# Patient Record
Sex: Male | Born: 1990 | Race: Black or African American | Hispanic: No | Marital: Married
Health system: Southern US, Community
[De-identification: ages and names within clinical notes are randomized; demographics above are authoritative.]

## PROBLEM LIST (undated history)

## (undated) DIAGNOSIS — Z21 Asymptomatic human immunodeficiency virus [HIV] infection status: Secondary | ICD-10-CM

## (undated) DIAGNOSIS — R7303 Prediabetes: Secondary | ICD-10-CM

## (undated) DIAGNOSIS — B20 Human immunodeficiency virus [HIV] disease: Secondary | ICD-10-CM

## (undated) DIAGNOSIS — J329 Chronic sinusitis, unspecified: Secondary | ICD-10-CM

## (undated) DIAGNOSIS — G43909 Migraine, unspecified, not intractable, without status migrainosus: Secondary | ICD-10-CM

## (undated) DIAGNOSIS — I10 Essential (primary) hypertension: Secondary | ICD-10-CM

## (undated) HISTORY — PX: HEMORROIDECTOMY: SUR656

## (undated) HISTORY — DX: Human immunodeficiency virus (HIV) disease: B20

## (undated) HISTORY — DX: Prediabetes: R73.03

## (undated) HISTORY — DX: Asymptomatic human immunodeficiency virus (hiv) infection status: Z21

## (undated) HISTORY — DX: Essential (primary) hypertension: I10

---
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
## 2008-05-05 ENCOUNTER — Emergency Department (HOSPITAL_COMMUNITY): Admission: EM | Admit: 2008-05-05 | Discharge: 2008-05-06 | Payer: Self-pay | Admitting: Emergency Medicine

## 2008-09-16 ENCOUNTER — Ambulatory Visit: Payer: Self-pay | Admitting: Family Medicine

## 2008-09-16 ENCOUNTER — Encounter: Payer: Self-pay | Admitting: Family Medicine

## 2008-09-19 ENCOUNTER — Emergency Department (HOSPITAL_COMMUNITY): Admission: EM | Admit: 2008-09-19 | Discharge: 2008-09-19 | Payer: Self-pay | Admitting: Emergency Medicine

## 2008-10-10 ENCOUNTER — Encounter: Payer: Self-pay | Admitting: Family Medicine

## 2008-10-10 ENCOUNTER — Ambulatory Visit: Payer: Self-pay | Admitting: Family Medicine

## 2008-10-13 ENCOUNTER — Encounter: Payer: Self-pay | Admitting: Family Medicine

## 2008-10-27 ENCOUNTER — Ambulatory Visit: Payer: Self-pay | Admitting: Family Medicine

## 2008-10-27 ENCOUNTER — Telehealth: Payer: Self-pay | Admitting: Family Medicine

## 2008-10-27 DIAGNOSIS — A630 Anogenital (venereal) warts: Secondary | ICD-10-CM

## 2008-11-21 ENCOUNTER — Emergency Department (HOSPITAL_COMMUNITY): Admission: EM | Admit: 2008-11-21 | Discharge: 2008-11-22 | Payer: Self-pay | Admitting: Emergency Medicine

## 2009-01-15 ENCOUNTER — Ambulatory Visit: Payer: Self-pay | Admitting: Family Medicine

## 2009-01-23 ENCOUNTER — Ambulatory Visit: Payer: Self-pay | Admitting: Family Medicine

## 2009-03-10 ENCOUNTER — Ambulatory Visit (HOSPITAL_BASED_OUTPATIENT_CLINIC_OR_DEPARTMENT_OTHER): Admission: RE | Admit: 2009-03-10 | Discharge: 2009-03-10 | Payer: Self-pay | Admitting: Surgery

## 2009-03-26 ENCOUNTER — Encounter: Payer: Self-pay | Admitting: Family Medicine

## 2009-05-13 ENCOUNTER — Emergency Department (HOSPITAL_COMMUNITY): Admission: EM | Admit: 2009-05-13 | Discharge: 2009-05-13 | Payer: Self-pay | Admitting: Emergency Medicine

## 2009-06-03 ENCOUNTER — Ambulatory Visit: Payer: Self-pay | Admitting: Family Medicine

## 2009-06-18 ENCOUNTER — Encounter: Payer: Self-pay | Admitting: Family Medicine

## 2009-08-03 ENCOUNTER — Emergency Department (HOSPITAL_COMMUNITY): Admission: EM | Admit: 2009-08-03 | Discharge: 2009-08-03 | Payer: Self-pay | Admitting: Emergency Medicine

## 2009-09-28 ENCOUNTER — Emergency Department (HOSPITAL_COMMUNITY): Admission: EM | Admit: 2009-09-28 | Discharge: 2009-09-29 | Payer: Self-pay | Admitting: Emergency Medicine

## 2009-10-16 ENCOUNTER — Encounter: Payer: Self-pay | Admitting: Family Medicine

## 2009-10-16 ENCOUNTER — Ambulatory Visit: Payer: Self-pay | Admitting: Family Medicine

## 2009-10-16 LAB — CONVERTED CEMR LAB
Chlamydia, Swab/Urine, PCR: NEGATIVE
GC Probe Amp, Urine: NEGATIVE

## 2009-11-24 ENCOUNTER — Emergency Department (HOSPITAL_COMMUNITY)
Admission: EM | Admit: 2009-11-24 | Discharge: 2009-11-24 | Payer: Self-pay | Source: Home / Self Care | Admitting: Emergency Medicine

## 2009-12-14 ENCOUNTER — Ambulatory Visit: Payer: Self-pay | Admitting: Family Medicine

## 2009-12-14 DIAGNOSIS — F411 Generalized anxiety disorder: Secondary | ICD-10-CM | POA: Insufficient documentation

## 2009-12-14 DIAGNOSIS — G44209 Tension-type headache, unspecified, not intractable: Secondary | ICD-10-CM

## 2010-02-09 NOTE — Assessment & Plan Note (Signed)
Summary: hemorriods,tcb   Vital Signs:  Patient profile:   20 year old male Height:      72.5 inches Weight:      186.8 pounds BMI:     25.08 Temp:     97.7 degrees F oral Pulse rate:   74 / minute BP sitting:   132 / 73  (left arm) Cuff size:   regular  Vitals Entered By: Garen Grams LPN (January 23, 2009 4:03 PM) CC: f/u hemmorhoids Is Patient Diabetic? No Pain Assessment Patient in pain? no        Primary Care Provider:  Myrtie Soman  MD  CC:  f/u hemmorhoids.  History of Present Illness: 1. condyloma States that these are not any better. Not particularly painful. Can feel them when he wipes and they often itch. This causes him to scratch until there is bleeding. Knows they're there all the time and this is troubling to him. Tries to keep the area very clean and scrubs the area when showering. Has not picked up HC cream yet (has rx).  Is homosexual and engages in anal sex. States that he always uses protection.  ROS: no blood in stool; no pain with bowel movements. No fever.     Habits & Providers  Alcohol-Tobacco-Diet     Tobacco Status: current     Cigarette Packs/Day: <0.25  Current Medications (verified): 1)  Anusol-Hc 25 Mg Supp (Hydrocortisone Acetate) .... Insert Into Anus Two Times A Day X2 Weeks. 2)  Anusol-Hc 2.5 % Crea (Hydrocortisone) .... Apply To Anus Two Times A Day X1 Month.  Disp Qs X1 Month.  Allergies (verified): No Known Drug Allergies  Social History: Smoking Status:  current Packs/Day:  <0.25  Physical Exam  General:  Well-developed,well-nourished,in no acute distress; alert,appropriate and cooperative throughout examination Rectal:  multiple external condyloma; no bleeding. no erythema, drainage, or discharge; normal rectal tone; no obvious hemorrhoids on external/internal digital examination. Anoscopy not performed.  Prostate:  Prostate gland firm and smooth, no enlargement, nodularity, tenderness, mass, asymmetry or  induration. Skin:  turgor normal, color normal, no rashes, and no suspicious lesions.     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 surgery. Gave information hand out. Counseled re: safe sex. Counseled regarding HPV and its assocation wtih oropharyngeal cancer.   Orders: Surgical Referral (Surgery) [facility]- Est Level  3 (29562)  Complete Medication List: 1)  Anusol-hc 25 Mg Supp (Hydrocortisone acetate) .... Insert into anus two times a day x2 weeks. 2)  Anusol-hc 2.5 % Crea (Hydrocortisone) .... Apply to anus two times a day x1 month.  disp qs x1 month.  Patient Instructions: 1)  we'll work on setting you up with the surgery clinic to have these looked at.  2)  In the meantime use the cream. 3)  Don't rub or scrub the area. This can make your itching and irritation worse. Use only water and mild soap for cleaning.

## 2010-02-09 NOTE — Assessment & Plan Note (Signed)
Summary: hemm,df   Vital Signs:  Patient profile:   20 year old male Height:      72.5 inches Weight:      186 pounds BMI:     24.97 Temp:     97.9 degrees F oral Pulse rate:   71 / minute BP sitting:   150 / 77  (left arm) Cuff size:   regular  Vitals Entered By: Tessie Fass CMA (January 15, 2009 1:38 PM) CC: hemorrhoids Is Patient Diabetic? No Pain Assessment Patient in pain? yes      Intensity: 9   Primary Care Provider:  Myrtie Soman  MD  CC:  hemorrhoids.  History of Present Illness: Mr. Allen comes in today with complaint of anal pain and masses he believes are hemorrhoids.  He was seen in October with same symptoms.  At that time he was found to have an anal condyloma accuminata and anoscopy was performed showing a fissure and internal hemorrhoid.  He was given hemorrhoid cream and suppository but didn't purchase because he couldn't afford.  However, the pain resolved on its own.  For last week has had similar pain again.  Hurts when he wipes and occassionally has drops of blood on the tissue. Wants the hemorrhoids removed (clarified and he wants the masses, or warts, removed) as he feels these are causing his pain.  He does engage in anal sex but not recently.  No contipation or diarrhea.    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  Discussed it can be removed with chemicals or freezing but also can still recur and these treatments can be painful.  Discussed we don't have  the chemicals here and so he would likely need referral to derm.  Return for recheck in 3-4 weeks.  If pain resolved, can further discuss treatment issues for the warts.   Orders: [facility]- Est  Level 4 (16109)  Problem # 2:  ANAL OR RECTAL PAIN (UEA-540.98) Assessment: Deteriorated  Precepted with Dr. Deirdre Priest.  Anoscopy deferred due to extreme tenderness.  No apparent fissure on external exam.  Likely small internal fissure or recurrence of hemorrhoids seen on anoscopy in October.  Now has debra hill assistance.  Re-prescripted anusol cream and suppository for pain.  Advised to return in 3-4 weeks for recheck.  If still having a lot of pain at that time, will need anoscopy.   Orders: [facility]- Est  Level 4 (99214)  Complete Medication List: 1)  Anusol-hc 25 Mg Supp (Hydrocortisone acetate) .... Insert into anus two times a day x2 weeks. 2)  Anusol-hc 2.5 % Crea (Hydrocortisone) .... Apply to anus two times a day x1 month.  disp qs x1 month.  Patient Instructions: 1)  The masses you feel on the outside are called condyloma, or warts.  They are not usually painful.  What is painful are hemorrhoids and fissures.  You likely have a recurrence of your hemorrhoids that you had in October.  Please use the 2 medicines for pain for the next 2-3 weeks to see if these calm  down your discomfort.  2)  Condylomata are not easy to treat.  Surgery to take them off is actually the last resort.  Usually chemicals or freezing are used first and this can be painful.  In addition, new warts can still come back.  However, if you still desire treatment to remove the actual masses even after the pain is subsided, we can try to get you to dermatology.  Unfortunately, we do not stock the chemicals necessary to treat them here.  3)  Please follow-up with your primary doctor, Dr. Rexene Alberts, in 3 weeks for a recheck to see how you are doing and what you would like to do about the masses.  Prescriptions: ANUSOL-HC 2.5 % CREA  (HYDROCORTISONE) Apply to anus two times a day x1 month.  Disp QS x1 month.  #1 x 0   Entered and Authorized by:   Ardeen Garland  MD   Signed by:   Ardeen Garland  MD on 01/15/2009   Method used:   Print then Give to Patient   RxID:   [id number] ANUSOL-HC 25 MG SUPP (HYDROCORTISONE ACETATE) Insert into anus two times a day x2 weeks.  #28 x 0   Entered and Authorized by:   Ardeen Garland  MD   Signed by:   Ardeen Garland  MD on 01/15/2009   Method used:   Print then Give to Patient   RxID:   [id number]
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
## 2010-02-09 NOTE — Consult Note (Signed)
Summary: [facility]   Imported By: De Nurse 06/10/2009 16:03:59  _____________________________________________________________________  External Attachment:    Type:   Image     Comment:   External Document
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
## 2010-02-09 NOTE — Letter (Signed)
Summary: Generic Letter  [facility]  [address]   [place], [place] [address]   Phone: [phone]  Fax: [phone]    10/13/2008  BRILEY SULTON [address] [place], [place]  [address]  Dear Mr. Bitterman,  I wanted to let you know that your HIV and syphilis tests came back normal. Remember to always use condoms. I hope that your stomach is feeling better. Good to see you the other day.  Sincerely,   Myrtie Soman  MD  Appended Document: Generic Letter mailed.

## 2010-02-09 NOTE — Miscellaneous (Signed)
   Clinical Lists Changes  Problems: Removed problem of ABDOMINAL PAIN, PERIUMBILIC (ICD-789.05) Removed problem of ANAL OR RECTAL PAIN (JWJ-191.47) Medications: Removed medication of PERCOCET 5-325 MG TABS (OXYCODONE-ACETAMINOPHEN) 1-2 by mouth every 4-6 hours as needed for severe pain Removed medication of FLEXERIL 10 MG TABS (CYCLOBENZAPRINE HCL) take one by mouth three times a day as needed for spasm
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
## 2010-02-10 ENCOUNTER — Emergency Department (HOSPITAL_COMMUNITY)
Admission: EM | Admit: 2010-02-10 | Discharge: 2010-02-11 | Disposition: A | Discharge status: Home / Self Care | Payer: No Typology Code available for payment source | Attending: Emergency Medicine | Admitting: Emergency Medicine

## 2010-02-10 DIAGNOSIS — R6883 Chills (without fever): Secondary | ICD-10-CM | POA: Insufficient documentation

## 2010-02-10 DIAGNOSIS — J029 Acute pharyngitis, unspecified: Secondary | ICD-10-CM | POA: Insufficient documentation

## 2010-02-10 DIAGNOSIS — J351 Hypertrophy of tonsils: Secondary | ICD-10-CM | POA: Insufficient documentation

## 2010-02-11 LAB — RAPID STREP SCREEN (MED CTR MEBANE ONLY): Streptococcus, Group A Screen (Direct): NEGATIVE

## 2010-02-15 ENCOUNTER — Inpatient Hospital Stay (INDEPENDENT_AMBULATORY_CARE_PROVIDER_SITE_OTHER)
Admission: RE | Admit: 2010-02-15 | Discharge: 2010-02-15 | Disposition: A | Discharge status: Home / Self Care | Payer: Self-pay | Source: Ambulatory Visit

## 2010-02-15 DIAGNOSIS — K5289 Other specified noninfective gastroenteritis and colitis: Secondary | ICD-10-CM

## 2010-02-23 ENCOUNTER — Encounter: Payer: Self-pay | Admitting: *Deleted

## 2010-03-08 ENCOUNTER — Emergency Department (HOSPITAL_COMMUNITY)
Admission: EM | Admit: 2010-03-08 | Discharge: 2010-03-08 | Disposition: A | Discharge status: Home / Self Care | Payer: No Typology Code available for payment source | Attending: Emergency Medicine | Admitting: Emergency Medicine

## 2010-03-08 ENCOUNTER — Emergency Department (HOSPITAL_COMMUNITY): Payer: Self-pay

## 2010-03-08 DIAGNOSIS — Y92410 Unspecified street and highway as the place of occurrence of the external cause: Secondary | ICD-10-CM | POA: Insufficient documentation

## 2010-03-08 DIAGNOSIS — T1490XA Injury, unspecified, initial encounter: Secondary | ICD-10-CM | POA: Insufficient documentation

## 2010-03-08 DIAGNOSIS — M25539 Pain in unspecified wrist: Secondary | ICD-10-CM | POA: Insufficient documentation

## 2010-03-15 ENCOUNTER — Encounter: Payer: Self-pay | Admitting: Family Medicine

## 2010-03-27 LAB — CBC
MCH: 30.5 pg (ref 26.0–34.0)
MCV: 89.4 fL (ref 78.0–100.0)
Platelets: 360 10*3/uL (ref 150–400)
RBC: 4.25 MIL/uL (ref 4.22–5.81)
RDW: 13.2 % (ref 11.5–15.5)

## 2010-03-27 LAB — POCT I-STAT, CHEM 8
BUN: 3 mg/dL — ABNORMAL LOW (ref 6–23)
Chloride: 102 mEq/L (ref 96–112)
Creatinine, Ser: 1.1 mg/dL (ref 0.4–1.5)
Sodium: 137 mEq/L (ref 135–145)
TCO2: 25 mmol/L (ref 0–100)

## 2010-03-27 LAB — DIFFERENTIAL
Eosinophils Absolute: 0.1 10*3/uL (ref 0.0–0.7)
Eosinophils Relative: 1 % (ref 0–5)
Lymphs Abs: 1.3 10*3/uL (ref 0.7–4.0)

## 2010-03-27 LAB — POCT CARDIAC MARKERS

## 2010-05-17 ENCOUNTER — Inpatient Hospital Stay (INDEPENDENT_AMBULATORY_CARE_PROVIDER_SITE_OTHER)
Admission: RE | Admit: 2010-05-17 | Discharge: 2010-05-17 | Disposition: A | Discharge status: Home / Self Care | Payer: Self-pay | Source: Ambulatory Visit | Attending: Emergency Medicine | Admitting: Emergency Medicine

## 2010-05-17 DIAGNOSIS — R51 Headache: Secondary | ICD-10-CM

## 2010-05-17 DIAGNOSIS — J039 Acute tonsillitis, unspecified: Secondary | ICD-10-CM

## 2010-05-17 LAB — POCT RAPID STREP A (OFFICE): Streptococcus, Group A Screen (Direct): NEGATIVE

## 2010-05-18 ENCOUNTER — Emergency Department (HOSPITAL_COMMUNITY)
Admission: EM | Admit: 2010-05-18 | Discharge: 2010-05-19 | Disposition: A | Discharge status: Home / Self Care | Payer: Self-pay | Attending: Emergency Medicine | Admitting: Emergency Medicine

## 2010-05-18 DIAGNOSIS — J029 Acute pharyngitis, unspecified: Secondary | ICD-10-CM | POA: Insufficient documentation

## 2010-07-20 ENCOUNTER — Emergency Department (HOSPITAL_COMMUNITY): Payer: No Typology Code available for payment source

## 2010-07-20 ENCOUNTER — Emergency Department (HOSPITAL_COMMUNITY)
Admission: EM | Admit: 2010-07-20 | Discharge: 2010-07-20 | Disposition: A | Discharge status: Home / Self Care | Payer: No Typology Code available for payment source | Attending: Emergency Medicine | Admitting: Emergency Medicine

## 2010-07-20 DIAGNOSIS — Y9289 Other specified places as the place of occurrence of the external cause: Secondary | ICD-10-CM | POA: Insufficient documentation

## 2010-07-20 DIAGNOSIS — S63509A Unspecified sprain of unspecified wrist, initial encounter: Secondary | ICD-10-CM | POA: Insufficient documentation

## 2010-07-20 DIAGNOSIS — M25539 Pain in unspecified wrist: Secondary | ICD-10-CM | POA: Insufficient documentation

## 2010-07-22 ENCOUNTER — Emergency Department (HOSPITAL_COMMUNITY)
Admission: EM | Admit: 2010-07-22 | Discharge: 2010-07-22 | Disposition: A | Discharge status: Home / Self Care | Payer: No Typology Code available for payment source | Attending: Emergency Medicine | Admitting: Emergency Medicine

## 2010-07-22 DIAGNOSIS — M549 Dorsalgia, unspecified: Secondary | ICD-10-CM | POA: Insufficient documentation

## 2010-07-22 DIAGNOSIS — T148XXA Other injury of unspecified body region, initial encounter: Secondary | ICD-10-CM | POA: Insufficient documentation

## 2010-10-05 LAB — URINALYSIS, ROUTINE W REFLEX MICROSCOPIC
Ketones, ur: NEGATIVE
Protein, ur: NEGATIVE
Urobilinogen, UA: 0.2

## 2010-10-05 LAB — DIFFERENTIAL
Basophils Absolute: 0
Basophils Relative: 0
Eosinophils Absolute: 0.2
Neutro Abs: 8.9 — ABNORMAL HIGH
Neutrophils Relative %: 75

## 2010-10-05 LAB — CBC
HCT: 41.4
Hemoglobin: 14.3
RBC: 4.74
RDW: 13.7
WBC: 12 — ABNORMAL HIGH

## 2010-10-05 LAB — COMPREHENSIVE METABOLIC PANEL
ALT: 17
Alkaline Phosphatase: 66
BUN: 8
CO2: 24
Chloride: 102
GFR calc non Af Amer: >60
Glucose, Bld: 96
Potassium: 3.6
Total Bilirubin: 0.8

## 2010-10-20 ENCOUNTER — Emergency Department (HOSPITAL_COMMUNITY)
Admission: EM | Admit: 2010-10-20 | Discharge: 2010-10-21 | Disposition: A | Discharge status: Home / Self Care | Payer: Self-pay | Attending: Emergency Medicine | Admitting: Emergency Medicine

## 2010-10-20 DIAGNOSIS — R51 Headache: Secondary | ICD-10-CM | POA: Insufficient documentation

## 2010-10-20 DIAGNOSIS — R112 Nausea with vomiting, unspecified: Secondary | ICD-10-CM | POA: Insufficient documentation

## 2010-10-20 DIAGNOSIS — H53149 Visual discomfort, unspecified: Secondary | ICD-10-CM | POA: Insufficient documentation

## 2010-11-17 ENCOUNTER — Encounter (HOSPITAL_COMMUNITY): Payer: Self-pay | Admitting: *Deleted

## 2010-11-17 ENCOUNTER — Emergency Department (HOSPITAL_COMMUNITY)
Admission: EM | Admit: 2010-11-17 | Discharge: 2010-11-17 | Disposition: A | Discharge status: Home / Self Care | Payer: Self-pay | Attending: Emergency Medicine | Admitting: Emergency Medicine

## 2010-11-17 DIAGNOSIS — K529 Noninfective gastroenteritis and colitis, unspecified: Secondary | ICD-10-CM

## 2010-11-17 DIAGNOSIS — K5289 Other specified noninfective gastroenteritis and colitis: Secondary | ICD-10-CM | POA: Insufficient documentation

## 2010-11-17 DIAGNOSIS — F17200 Nicotine dependence, unspecified, uncomplicated: Secondary | ICD-10-CM | POA: Insufficient documentation

## 2010-11-17 DIAGNOSIS — R51 Headache: Secondary | ICD-10-CM | POA: Insufficient documentation

## 2010-11-17 MED ORDER — DIPHENOXYLATE-ATROPINE 2.5-0.025 MG PO TABS
2.0000 | ORAL_TABLET | Freq: Once | ORAL | Status: AC
  Administered 2010-11-17: 2 via ORAL
  Filled 2010-11-17: qty 2

## 2010-11-17 MED ORDER — LOPERAMIDE HCL 2 MG PO CAPS: 2.0000 mg | ORAL_CAPSULE | Freq: Four times a day (QID) | ORAL | Status: AC | PRN

## 2010-11-17 NOTE — ED Notes (Signed)
Pt in c/o abd pain and headache in 5am today, also with diarrhea

## 2010-11-17 NOTE — ED Notes (Signed)
Pt given discharge instructions and verbalizes understanding  

## 2010-11-17 NOTE — ED Notes (Signed)
Pt to ED with c/o diarrhea since this am.  Pt states has had some abd pain as well. Pt denies any pain at this time

## 2010-11-17 NOTE — ED Notes (Signed)
Pt medicated as ordered, See MAR 
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
## 2011-03-08 ENCOUNTER — Encounter (HOSPITAL_COMMUNITY): Payer: Self-pay | Admitting: *Deleted

## 2011-03-08 ENCOUNTER — Emergency Department (HOSPITAL_COMMUNITY)
Admission: EM | Admit: 2011-03-08 | Discharge: 2011-03-08 | Disposition: A | Discharge status: Home / Self Care | Payer: Self-pay | Attending: Emergency Medicine | Admitting: Emergency Medicine

## 2011-03-08 DIAGNOSIS — K5289 Other specified noninfective gastroenteritis and colitis: Secondary | ICD-10-CM | POA: Insufficient documentation

## 2011-03-08 DIAGNOSIS — K529 Noninfective gastroenteritis and colitis, unspecified: Secondary | ICD-10-CM

## 2011-03-08 MED ORDER — PANTOPRAZOLE SODIUM 40 MG IV SOLR
INTRAVENOUS | Status: AC
  Filled 2011-03-08: qty 40

## 2011-03-08 MED ORDER — ONDANSETRON HCL 4 MG/2ML IJ SOLN
4.0000 mg | Freq: Once | INTRAMUSCULAR | Status: AC
  Administered 2011-03-08: 4 mg via INTRAVENOUS

## 2011-03-08 MED ORDER — PANTOPRAZOLE SODIUM 40 MG IV SOLR
40.0000 mg | Freq: Once | INTRAVENOUS | Status: AC
  Administered 2011-03-08: 40 mg via INTRAVENOUS

## 2011-03-08 MED ORDER — PROMETHAZINE HCL 25 MG PO TABS: 25.0000 mg | ORAL_TABLET | Freq: Four times a day (QID) | ORAL | Status: AC | PRN

## 2011-03-08 MED ORDER — ONDANSETRON HCL 4 MG/2ML IJ SOLN
INTRAMUSCULAR | Status: AC
  Filled 2011-03-08: qty 2

## 2011-03-08 MED ORDER — SODIUM CHLORIDE 0.9 % IV BOLUS (SEPSIS)
1000.0000 mL | Freq: Once | INTRAVENOUS | Status: AC
  Administered 2011-03-08: 1000 mL via INTRAVENOUS

## 2011-03-08 NOTE — ED Notes (Signed)
Pt in c/o abd pain with n/v/d since Sunday, other family members with same

## 2011-03-08 NOTE — Discharge Instructions (Signed)
B.R.A.T. Diet Your doctor has recommended the B.R.A.T. diet for you or your child until the condition improves. This is often used to help control diarrhea and vomiting symptoms. If you or your child can tolerate clear liquids, you may have:  Bananas.   Rice.   Applesauce.   Toast (and other simple starches such as crackers, potatoes, noodles).  Be sure to avoid dairy products, meats, and fatty foods until symptoms are better. Fruit juices such as apple, grape, and prune juice can make diarrhea worse. Avoid these. Continue this diet for 2 days or as instructed by your caregiver. Document Released: 12/27/2004 Document Revised: 09/08/2010 Document Reviewed: 06/15/2006 ExitCare Patient Information 2012 [employer].Clear Liquid Diet The clear liquid dietconsists of foods that are liquid or will become liquid at room temperature.You should be able to see through the liquid and beverages. Examples of foods allowed on a clear liquid diet include fruit juice, broth or bouillon, gelatin, or frozen ice pops. The purpose of this diet is to provide necessary fluid, electrolytes such a sodium and potassium, and energy to keep the body functioning during times when you are not able to consume a regular diet.A clear liquid diet should not be continued for long periods of time as it is not nutritionally adequate.  REASONS FOR USING A CLEAR LIQUID DIET  In sudden onset (acute) conditions for a patient before or after surgery.   As the first step in oral feeding.   For fluid and electrolyte replacement in diarrheal diseases.   As a diet before certain medical tests are performed.  ADEQUACY The clear liquid diet is adequate only in ascorbic acid, according to the Recommended Dietary Allowances of the National Research Council. CHOOSING FOODS Breads and Starches  Allowed:  None are allowed.   Avoid: All are avoided.  Vegetables  Allowed:  Strained tomato or vegetable juice.   Avoid: Any  others.  Fruit  Allowed:  Strained fruit juices and fruit drinks. Include 1 serving of citrus or vitamin C-enriched fruit juice daily.   Avoid: Any others.  Meat and Meat Substitutes  Allowed:  None are allowed.   Avoid: All are avoided.  Milk  Allowed:  None are allowed.   Avoid: All are avoided.  Soups and Combination Foods  Allowed:  Clear bouillon, broth, or strained broth-based soups.   Avoid: Any others.  Desserts and Sweets  Allowed:  Sugar, honey. High protein gelatin. Flavored gelatin, ices, or frozen ice pops that do not contain milk.   Avoid: Any others.  Fats and Oils  Allowed:  None are allowed.   Avoid: All are avoided.  Beverages  Allowed:  Carbonated beverages, cereal beverages, coffee (regular or decaffeinated), or tea.   Avoid: Any others.  Condiments  Allowed:  Iodized salt.   Avoid: Any others, including pepper.  Supplements  Allowed:  Liquid nutrition beverages.   Avoid: Any others that contain lactose or fiber.  SAMPLE MEAL PLAN Breakfast  4 oz strained orange juice.    to 1 cup gelatin (plain or fortified).   1 cup beverage (coffee or tea).   Sugar, if desired.  Midmorning Snack   cup gelatin (plain or fortified).  Lunch  1 cup broth or consomm.   4 oz strained grapefruit juice.    cup gelatin (plain or fortified).   1 cup beverage (coffee or tea).   Sugar, if desired.  Midafternoon Snack   cup fruit ice.    cup strained fruit juice.  Dinner  1   cup broth or consomm.    cup cranberry juice.    cup flavored gelatin (plain or fortified).   1 cup beverage (coffee or tea).   Sugar, if desired.  Evening Snack  4 oz strained apple juice (vitamin C-fortified).    cup flavored gelatin (plain or fortified).  Document Released: 12/27/2004 Document Revised: 09/08/2010 Document Reviewed: 03/26/2010 Exit[employer] Patient Information 2012 [employer]. 

## 2011-03-08 NOTE — ED Notes (Signed)
Pt tolerating PO fluids
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
## 2011-03-08 NOTE — ED Provider Notes (Signed)
Medical screening examination/treatment/procedure(s) were performed by non-physician practitioner and as supervising physician I was immediately available for consultation/collaboration.   Vida Roller, MD 03/08/11 2131

## 2011-05-05 ENCOUNTER — Emergency Department (HOSPITAL_COMMUNITY)
Admission: EM | Admit: 2011-05-05 | Discharge: 2011-05-05 | Disposition: A | Discharge status: Home / Self Care | Payer: Self-pay | Attending: Emergency Medicine | Admitting: Emergency Medicine

## 2011-05-05 ENCOUNTER — Encounter (HOSPITAL_COMMUNITY): Payer: Self-pay | Admitting: Emergency Medicine

## 2011-05-05 DIAGNOSIS — J029 Acute pharyngitis, unspecified: Secondary | ICD-10-CM | POA: Insufficient documentation

## 2011-05-05 DIAGNOSIS — R5381 Other malaise: Secondary | ICD-10-CM | POA: Insufficient documentation

## 2011-05-05 DIAGNOSIS — H9209 Otalgia, unspecified ear: Secondary | ICD-10-CM | POA: Insufficient documentation

## 2011-05-05 DIAGNOSIS — R5383 Other fatigue: Secondary | ICD-10-CM | POA: Insufficient documentation

## 2011-05-05 HISTORY — DX: Chronic sinusitis, unspecified: J32.9

## 2011-05-05 LAB — RAPID STREP SCREEN (MED CTR MEBANE ONLY): Streptococcus, Group A Screen (Direct): NEGATIVE

## 2011-05-05 MED ORDER — PREDNISONE 20 MG PO TABS
60.0000 mg | ORAL_TABLET | Freq: Once | ORAL | Status: AC
  Administered 2011-05-05: 60 mg via ORAL
  Filled 2011-05-05: qty 3

## 2011-05-05 MED ORDER — AZITHROMYCIN 250 MG PO TABS: ORAL_TABLET | ORAL | Status: AC

## 2011-05-05 NOTE — ED Notes (Addendum)
Pt reports having sore throat, congestion, and having difficulty hearing out or R ear, reports swelling to back of throat X3 days; pt denies fevers, reports difficulty swallowing, denies difficulty breathing; pt resp e/u, NAD; pt with swelling to back of throat and noted ear wax in R ear
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
## 2011-05-05 NOTE — Discharge Instructions (Signed)
Your strep throat test was negative today. Use Tylenol and ibuprofen for pain. Drink plenty of fluids to stay hydrated. If your symptoms were not improving used to antibiotics were prescribed for the full length of time. Please followup with a primary care provider.   Pharyngitis, Viral and Bacterial Pharyngitis is soreness (inflammation) or infection of the pharynx. It is also called a sore throat. CAUSES  Most sore throats are caused by viruses and are part of a cold. However, some sore throats are caused by strep and other bacteria. Sore throats can also be caused by post nasal drip from draining sinuses, allergies and sometimes from sleeping with an open mouth. Infectious sore throats can be spread from person to person by coughing, sneezing and sharing cups or eating utensils. TREATMENT  Sore throats that are viral usually last 3-4 days. Viral illness will get better without medications (antibiotics). Strep throat and other bacterial infections will usually begin to get better about 24-48 hours after you begin to take antibiotics. HOME CARE INSTRUCTIONS   If the caregiver feels there is a bacterial infection or if there is a positive strep test, they will prescribe an antibiotic. The full course of antibiotics must be taken. If the full course of antibiotic is not taken, you or your child may become ill again. If you or your child has strep throat and do not finish all of the medication, serious heart or kidney diseases may develop.   Drink enough water and fluids to keep your urine clear or pale yellow.   Only take over-the-counter or prescription medicines for pain, discomfort or fever as directed by your caregiver.   Get lots of rest.   Gargle with salt water ( tsp. of salt in a glass of water) as often as every 1-2 hours as you need for comfort.   Hard candies may soothe the throat if individual is not at risk for choking. Throat sprays or lozenges may also be used.  SEEK MEDICAL CARE  IF:   Large, tender lumps in the neck develop.   A rash develops.   Green, yellow-brown or bloody sputum is coughed up.   Your baby is older than 3 months with a rectal temperature of 100.5 F (38.1 C) or higher for more than 1 day.  SEEK IMMEDIATE MEDICAL CARE IF:   A stiff neck develops.   You or your child are drooling or unable to swallow liquids.   You or your child are vomiting, unable to keep medications or liquids down.   You or your child has severe pain, unrelieved with recommended medications.   You or your child are having difficulty breathing (not due to stuffy nose).   You or your child are unable to fully open your mouth.   You or your child develop redness, swelling, or severe pain anywhere on the neck.   You have a fever.   Your baby is older than 3 months with a rectal temperature of 102 F (38.9 C) or higher.   Your baby is 66 months old or younger with a rectal temperature of 100.4 F (38 C) or higher.  MAKE SURE YOU:   Understand these instructions.   Will watch your condition.   Will get help right away if you are not doing well or get worse.  Document Released: 12/27/2004 Document Revised: 12/16/2010 Document Reviewed: 03/26/2007 [facility] Patient Information 2012 [place], [place].    RESOURCE GUIDE  Dental Problems  Patients with Medicaid: [facility]  [facility] [address]                                           [phone] [address] [employer] [address] Phone:  [phone]                                                  Phone:  [phone]  If unable to pay or uninsured, contact:  [facility] or [facility] to become qualified for the adult dental clinic.  Chronic Pain Problems Contact [facility]  [phone] Patients need to be referred by their primary care doctor.  Insufficient Money for Medicine Contact [employer] Way:  call "211" or [facility] 903-120-4430.  No  Primary Care Doctor Call [facility]  5305823249 Other agencies that provide inexpensive medical care    [facility]  [phone]    [facility]  [phone]    [facility]  [phone]    [facility]  [phone]    [facility]  [phone]    [facility]  [phone]  Psychological Services [facility]  [phone] [facility]  [phone] [facility]   [phone] (emergency services [phone])  Substance Abuse Resources [facility]  [phone] [facility] [phone] [facility] [phone] Floydene Flock [phone] Residential & Outpatient Substance Abuse Program  ([phone]  Abuse/Neglect [facility] Child Abuse Hotline (256)791-8327 [facility] Child Abuse Hotline (351)754-7587 (After Hours)  Emergency Shelter [facility] (848)153-0783  M[facility] [place] [facility] [phone] [facility] [phone]  MRSA Hotline #:   (713) 393-8336    [facility] Resources  [facility]     [employer]                          [facility] Dept. [address] [place]                       [address]      [address]  Blondell Reveal Phone:  [phone]                                   Phone:  [phone]                 Phone:  [phone]  [facility] Phone:  [phone]  [facility] Child Abuse Hotline [phone] [phone] (After Hours)

## 2011-05-06 NOTE — ED Provider Notes (Signed)
Medical screening examination/treatment/procedure(s) were performed by non-physician practitioner and as supervising physician I was immediately available for consultation/collaboration.   Glynn Octave, MD 05/06/11 1046

## 2011-05-23 ENCOUNTER — Encounter (HOSPITAL_COMMUNITY): Payer: Self-pay | Admitting: Emergency Medicine

## 2011-05-23 ENCOUNTER — Emergency Department (HOSPITAL_COMMUNITY)
Admission: EM | Admit: 2011-05-23 | Discharge: 2011-05-23 | Disposition: A | Discharge status: Home / Self Care | Payer: Self-pay | Attending: Emergency Medicine | Admitting: Emergency Medicine

## 2011-05-23 DIAGNOSIS — K602 Anal fissure, unspecified: Secondary | ICD-10-CM | POA: Insufficient documentation

## 2011-05-23 DIAGNOSIS — F17200 Nicotine dependence, unspecified, uncomplicated: Secondary | ICD-10-CM | POA: Insufficient documentation

## 2011-05-23 LAB — POCT I-STAT, CHEM 8
BUN: 6 mg/dL (ref 6–23)
Chloride: 102 mEq/L (ref 96–112)
Creatinine, Ser: 1.1 mg/dL (ref 0.50–1.35)
Potassium: 3.3 mEq/L — ABNORMAL LOW (ref 3.5–5.1)
Sodium: 141 mEq/L (ref 135–145)
TCO2: 27 mmol/L (ref 0–100)

## 2011-05-23 MED ORDER — LIDOCAINE HCL 2 % EX GEL: CUTANEOUS | Status: DC | PRN

## 2011-05-23 MED ORDER — HYDROCORTISONE 2.5 % RE CREA: TOPICAL_CREAM | RECTAL | Status: DC

## 2011-05-23 NOTE — Discharge Instructions (Signed)
Return here as needed. Keep area clean with soap and water. Follow up with your doctor for a recheck.

## 2011-05-23 NOTE — ED Notes (Signed)
Pt reports rectal pain for a few days; unsure of injury; painful to have BM, and noticed bleeding; denies abd pain; LBM 5/13
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
## 2011-05-23 NOTE — ED Notes (Signed)
See triage note  Pt alert 

## 2011-05-24 NOTE — ED Provider Notes (Signed)
Medical screening examination/treatment/procedure(s) were performed by non-physician practitioner and as supervising physician I was immediately available for consultation/collaboration.  Flint Melter, MD 05/24/11 [phone]

## 2011-09-02 ENCOUNTER — Emergency Department (HOSPITAL_COMMUNITY)
Admission: EM | Admit: 2011-09-02 | Discharge: 2011-09-02 | Disposition: A | Discharge status: Home / Self Care | Payer: Self-pay | Attending: Emergency Medicine | Admitting: Emergency Medicine

## 2011-09-02 ENCOUNTER — Encounter (HOSPITAL_COMMUNITY): Payer: Self-pay | Admitting: Emergency Medicine

## 2011-09-02 ENCOUNTER — Emergency Department (HOSPITAL_COMMUNITY): Payer: Self-pay

## 2011-09-02 DIAGNOSIS — Z833 Family history of diabetes mellitus: Secondary | ICD-10-CM | POA: Insufficient documentation

## 2011-09-02 DIAGNOSIS — F17200 Nicotine dependence, unspecified, uncomplicated: Secondary | ICD-10-CM | POA: Insufficient documentation

## 2011-09-02 DIAGNOSIS — W208XXA Other cause of strike by thrown, projected or falling object, initial encounter: Secondary | ICD-10-CM | POA: Insufficient documentation

## 2011-09-02 DIAGNOSIS — Z8249 Family history of ischemic heart disease and other diseases of the circulatory system: Secondary | ICD-10-CM | POA: Insufficient documentation

## 2011-09-02 DIAGNOSIS — S9030XA Contusion of unspecified foot, initial encounter: Secondary | ICD-10-CM | POA: Insufficient documentation

## 2011-09-02 MED ORDER — TRAMADOL HCL 50 MG PO TABS
50.0000 mg | ORAL_TABLET | Freq: Once | ORAL | Status: AC
  Administered 2011-09-02: 50 mg via ORAL
  Filled 2011-09-02: qty 1

## 2011-09-02 MED ORDER — TRAMADOL HCL 50 MG PO TABS: 50.0000 mg | ORAL_TABLET | Freq: Four times a day (QID) | ORAL | Status: AC | PRN

## 2011-09-02 NOTE — ED Notes (Signed)
Pt verbalizes understanding 
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
## 2011-09-02 NOTE — ED Notes (Signed)
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

## 2011-09-03 NOTE — ED Provider Notes (Signed)
Medical screening examination/treatment/procedure(s) were performed by non-physician practitioner and as supervising physician I was immediately available for consultation/collaboration.   Loren Racer, MD 09/03/11 [phone]

## 2011-10-04 DIAGNOSIS — Z833 Family history of diabetes mellitus: Secondary | ICD-10-CM | POA: Insufficient documentation

## 2011-10-04 DIAGNOSIS — G5600 Carpal tunnel syndrome, unspecified upper limb: Secondary | ICD-10-CM | POA: Insufficient documentation

## 2011-10-04 DIAGNOSIS — F17200 Nicotine dependence, unspecified, uncomplicated: Secondary | ICD-10-CM | POA: Insufficient documentation

## 2011-10-04 DIAGNOSIS — Y9229 Other specified public building as the place of occurrence of the external cause: Secondary | ICD-10-CM | POA: Insufficient documentation

## 2011-10-04 DIAGNOSIS — Z8249 Family history of ischemic heart disease and other diseases of the circulatory system: Secondary | ICD-10-CM | POA: Insufficient documentation

## 2011-10-04 DIAGNOSIS — S139XXA Sprain of joints and ligaments of unspecified parts of neck, initial encounter: Secondary | ICD-10-CM | POA: Insufficient documentation

## 2011-10-05 ENCOUNTER — Encounter (HOSPITAL_COMMUNITY): Payer: Self-pay | Admitting: Emergency Medicine

## 2011-10-05 ENCOUNTER — Emergency Department (HOSPITAL_COMMUNITY)
Admission: EM | Admit: 2011-10-05 | Discharge: 2011-10-05 | Disposition: A | Discharge status: Home / Self Care | Payer: Self-pay | Attending: Emergency Medicine | Admitting: Emergency Medicine

## 2011-10-05 DIAGNOSIS — G5602 Carpal tunnel syndrome, left upper limb: Secondary | ICD-10-CM

## 2011-10-05 DIAGNOSIS — S161XXA Strain of muscle, fascia and tendon at neck level, initial encounter: Secondary | ICD-10-CM

## 2011-10-05 MED ORDER — CYCLOBENZAPRINE HCL 10 MG PO TABS: 5.0000 mg | ORAL_TABLET | Freq: Two times a day (BID) | ORAL | Status: DC | PRN

## 2011-10-05 MED ORDER — IBUPROFEN 800 MG PO TABS: 800.0000 mg | ORAL_TABLET | Freq: Three times a day (TID) | ORAL | Status: DC
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
## 2011-10-06 ENCOUNTER — Ambulatory Visit (INDEPENDENT_AMBULATORY_CARE_PROVIDER_SITE_OTHER): Payer: Self-pay | Admitting: Family Medicine

## 2011-10-06 ENCOUNTER — Encounter: Payer: Self-pay | Admitting: Family Medicine

## 2011-10-06 VITALS — BP 146/97 | HR 65 | Temp 97.7°F | Ht 73.75 in | Wt 202.0 lb

## 2011-10-06 DIAGNOSIS — M25539 Pain in unspecified wrist: Secondary | ICD-10-CM

## 2011-10-06 MED ORDER — NAPROXEN 500 MG PO TABS: 500.0000 mg | ORAL_TABLET | Freq: Two times a day (BID) | ORAL | Status: DC

## 2011-10-06 NOTE — Patient Instructions (Signed)
I am sorry your wrist is hurting so badly, I think you have an overuse wrist injury.  Please takee the naproxen twice a day for a week or two.  Also, you should ice your wrist for 20 minutes at least two times a day.  Please come back if you are not feeling better in about 2 weeks.

## 2011-10-06 NOTE — Assessment & Plan Note (Signed)
History and exam are inconsistent with carpel tunnel.  I feel this is more likely an over-use tendinopathy. Will write him for light duty/out of work to complete 7 days of rest.  rx naproxen, advised ice, rest, elevation, and wearing wrist splint when active.  F/U in two weeks if not improving.

## 2011-10-06 NOTE — Progress Notes (Signed)
  Subjective:    Patient ID: Douglas Bridges, male    DOB: 23-Jun-1990, 23 y.o.   MRN: [id number]  HPI  Douglas Bridges comes in with R wrist pain x 2 months.  He went to the ER two days ago and says they told him he had carpel tunnel.  They gave him a wrist splint and a prescription for ibuprofen.  He is not any better.  He works at [employer], 5 days a week, at least 8 hours a day.  He is right handed.  He says the pain is in the wrist (points to carpels), and it hurts to move it.  He denies any injury.  He also denies any numbness or tingling, or weakness in the hand.    Review of Systems See HPI    Objective:   Physical Exam  BP 146/97  Pulse 65  Temp 97.7 F (36.5 C) (Oral)  Ht 6' 1.75" (1.873 m)  Wt 202 lb (91.627 kg)  BMI 26.11 kg/m2 General appearance: alert, cooperative and no distress Wrist: Mildly swollen compared to left. He has tenderness to palpation over carpels.  ROM smooth and normal with good flexion and extension and ulnar/radial deviation that is symmetrical with opposite wrist. Palpation is normal over metacarpals, navicular, lunate.   Strength 5/5 in all directions without pain, but he has pain at wrist with strength testing, both flexion and extension of wrist.  Denies elbow pain.  Negative Finkelstein, tinel's and phalens.       Assessment & Plan:

## 2011-10-06 NOTE — ED Provider Notes (Signed)
Medical screening examination/treatment/procedure(s) were performed by non-physician practitioner and as supervising physician I was immediately available for consultation/collaboration.  Heatherly Stenner, MD 10/06/11 0024 

## 2012-03-30 ENCOUNTER — Encounter (HOSPITAL_COMMUNITY): Payer: Self-pay | Admitting: Emergency Medicine

## 2012-03-30 ENCOUNTER — Emergency Department (HOSPITAL_COMMUNITY)
Admission: EM | Admit: 2012-03-30 | Discharge: 2012-03-30 | Disposition: A | Discharge status: Home / Self Care | Payer: Self-pay | Attending: Emergency Medicine | Admitting: Emergency Medicine

## 2012-03-30 DIAGNOSIS — Z87828 Personal history of other (healed) physical injury and trauma: Secondary | ICD-10-CM | POA: Insufficient documentation

## 2012-03-30 DIAGNOSIS — R110 Nausea: Secondary | ICD-10-CM | POA: Insufficient documentation

## 2012-03-30 DIAGNOSIS — H538 Other visual disturbances: Secondary | ICD-10-CM | POA: Insufficient documentation

## 2012-03-30 DIAGNOSIS — Z8679 Personal history of other diseases of the circulatory system: Secondary | ICD-10-CM | POA: Insufficient documentation

## 2012-03-30 DIAGNOSIS — Z8709 Personal history of other diseases of the respiratory system: Secondary | ICD-10-CM | POA: Insufficient documentation

## 2012-03-30 DIAGNOSIS — R51 Headache: Secondary | ICD-10-CM

## 2012-03-30 DIAGNOSIS — F17200 Nicotine dependence, unspecified, uncomplicated: Secondary | ICD-10-CM | POA: Insufficient documentation

## 2012-03-30 MED ORDER — SODIUM CHLORIDE 0.9 % IV BOLUS (SEPSIS): 1000.0000 mL | Freq: Once | INTRAVENOUS | Status: DC

## 2012-03-30 MED ORDER — DEXAMETHASONE SODIUM PHOSPHATE 4 MG/ML IJ SOLN
10.0000 mg | Freq: Once | INTRAMUSCULAR | Status: DC
  Filled 2012-03-30: qty 1

## 2012-03-30 MED ORDER — METOCLOPRAMIDE HCL 5 MG/ML IJ SOLN
10.0000 mg | Freq: Once | INTRAMUSCULAR | Status: DC
  Filled 2012-03-30: qty 2

## 2012-03-30 MED ORDER — DIPHENHYDRAMINE HCL 50 MG/ML IJ SOLN
25.0000 mg | Freq: Once | INTRAMUSCULAR | Status: DC
  Filled 2012-03-30: qty 1

## 2012-03-30 NOTE — ED Notes (Signed)
Pt states he has nausea and diarrhea   Pt denies vomiting  Pt states he also has a migraine headache and that his vision has been blurry

## 2012-03-30 NOTE — ED Notes (Signed)
Pt. Refused for all IV medications ordered by MD. Lennie Hummer that he is not that bad," its just a headache." MD notified.
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
## 2012-04-18 ENCOUNTER — Encounter (HOSPITAL_COMMUNITY): Payer: Self-pay | Admitting: *Deleted

## 2012-04-18 ENCOUNTER — Encounter (HOSPITAL_COMMUNITY): Payer: Self-pay | Admitting: Emergency Medicine

## 2012-04-18 ENCOUNTER — Emergency Department (HOSPITAL_COMMUNITY)
Admission: EM | Admit: 2012-04-18 | Discharge: 2012-04-18 | Disposition: A | Discharge status: Home / Self Care | Payer: Self-pay | Attending: Emergency Medicine | Admitting: Emergency Medicine

## 2012-04-18 ENCOUNTER — Emergency Department (HOSPITAL_COMMUNITY): Payer: Self-pay

## 2012-04-18 DIAGNOSIS — Z8709 Personal history of other diseases of the respiratory system: Secondary | ICD-10-CM | POA: Insufficient documentation

## 2012-04-18 DIAGNOSIS — Z87828 Personal history of other (healed) physical injury and trauma: Secondary | ICD-10-CM | POA: Insufficient documentation

## 2012-04-18 DIAGNOSIS — E876 Hypokalemia: Secondary | ICD-10-CM | POA: Insufficient documentation

## 2012-04-18 DIAGNOSIS — Z9889 Other specified postprocedural states: Secondary | ICD-10-CM | POA: Insufficient documentation

## 2012-04-18 DIAGNOSIS — J329 Chronic sinusitis, unspecified: Secondary | ICD-10-CM | POA: Insufficient documentation

## 2012-04-18 DIAGNOSIS — F17200 Nicotine dependence, unspecified, uncomplicated: Secondary | ICD-10-CM | POA: Insufficient documentation

## 2012-04-18 DIAGNOSIS — R1013 Epigastric pain: Secondary | ICD-10-CM | POA: Insufficient documentation

## 2012-04-18 DIAGNOSIS — K7689 Other specified diseases of liver: Secondary | ICD-10-CM | POA: Insufficient documentation

## 2012-04-18 DIAGNOSIS — R1033 Periumbilical pain: Secondary | ICD-10-CM | POA: Insufficient documentation

## 2012-04-18 DIAGNOSIS — R52 Pain, unspecified: Secondary | ICD-10-CM | POA: Insufficient documentation

## 2012-04-18 LAB — CBC WITH DIFFERENTIAL/PLATELET
Basophils Relative: 1 % (ref 0–1)
Eosinophils Relative: 2 % (ref 0–5)
HCT: 42.2 % (ref 39.0–52.0)
Hemoglobin: 14.3 g/dL (ref 13.0–17.0)
Lymphs Abs: 2 10*3/uL (ref 0.7–4.0)
MCH: 29.8 pg (ref 26.0–34.0)
MCV: 87.9 fL (ref 78.0–100.0)
Monocytes Absolute: 0.7 10*3/uL (ref 0.1–1.0)
Monocytes Relative: 9 % (ref 3–12)
RBC: 4.8 MIL/uL (ref 4.22–5.81)
WBC: 7.3 10*3/uL (ref 4.0–10.5)

## 2012-04-18 LAB — COMPREHENSIVE METABOLIC PANEL
ALT: 11 U/L (ref 0–53)
AST: 19 U/L (ref 0–37)
CO2: 27 mEq/L (ref 19–32)
Calcium: 9.9 mg/dL (ref 8.4–10.5)
Chloride: 99 mEq/L (ref 96–112)
Creatinine, Ser: 0.98 mg/dL (ref 0.50–1.35)
GFR calc Af Amer: >90 mL/min (ref >90)
GFR calc non Af Amer: >90 mL/min (ref >90)
Glucose, Bld: 94 mg/dL (ref 70–99)
Total Bilirubin: 0.5 mg/dL (ref 0.3–1.2)

## 2012-04-18 LAB — URINALYSIS, MICROSCOPIC ONLY
Bilirubin Urine: NEGATIVE
Hgb urine dipstick: NEGATIVE
Nitrite: NEGATIVE
Protein, ur: 30 mg/dL — AB
Specific Gravity, Urine: 1.035 — ABNORMAL HIGH (ref 1.005–1.030)
Urobilinogen, UA: 1 mg/dL (ref 0.0–1.0)

## 2012-04-18 MED ORDER — IOHEXOL 300 MG/ML  SOLN
50.0000 mL | Freq: Once | INTRAMUSCULAR | Status: AC | PRN
  Administered 2012-04-18: 50 mL via ORAL

## 2012-04-18 MED ORDER — OXYCODONE-ACETAMINOPHEN 5-325 MG PO TABS: ORAL_TABLET | ORAL | Status: DC

## 2012-04-18 MED ORDER — POTASSIUM CHLORIDE CRYS ER 20 MEQ PO TBCR
20.0000 meq | EXTENDED_RELEASE_TABLET | Freq: Once | ORAL | Status: AC
  Administered 2012-04-18: 20 meq via ORAL
  Filled 2012-04-18: qty 1

## 2012-04-18 MED ORDER — ONDANSETRON HCL 4 MG/2ML IJ SOLN
4.0000 mg | Freq: Once | INTRAMUSCULAR | Status: AC
  Administered 2012-04-18: 4 mg via INTRAVENOUS
  Filled 2012-04-18: qty 2

## 2012-04-18 MED ORDER — MORPHINE SULFATE 4 MG/ML IJ SOLN
4.0000 mg | Freq: Once | INTRAMUSCULAR | Status: AC
  Administered 2012-04-18: 4 mg via INTRAVENOUS
  Filled 2012-04-18: qty 1

## 2012-04-18 MED ORDER — MORPHINE SULFATE 4 MG/ML IJ SOLN
4.0000 mg | INTRAMUSCULAR | Status: DC | PRN
  Administered 2012-04-18: 4 mg via INTRAVENOUS
  Filled 2012-04-18: qty 1

## 2012-04-18 MED ORDER — ONDANSETRON HCL 4 MG/2ML IJ SOLN
4.0000 mg | Freq: Once | INTRAMUSCULAR | Status: AC
Start: 2012-04-18 — End: 2012-04-18
  Administered 2012-04-18: 4 mg via INTRAVENOUS
  Filled 2012-04-18: qty 2

## 2012-04-18 MED ORDER — IOHEXOL 300 MG/ML  SOLN
100.0000 mL | Freq: Once | INTRAMUSCULAR | Status: AC | PRN
  Administered 2012-04-18: 100 mL via INTRAVENOUS

## 2012-04-18 NOTE — ED Notes (Signed)
Pt alert, arrives from home, c/o abd pain, describes as dull, generalized, denies n/v, resp even unlabored, skin pwd
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
## 2012-04-18 NOTE — ED Provider Notes (Signed)
Douglas Bridges is a 22 y.o. male complaining of diffuse generalized abdominal pain over the last 24 hours. Sign out from NP Tomasa Blase at shift change. Patient denies fever, nausea vomiting, change in bowel or bladder habits. Plan is to followup labs and DC to home pending normal values.  6:35 AM patient seen and evaluated at the bedside, he is resting comfortably dozing off. Patient states his pain is only mildly improved with morphine. Lung sounds are clear to auscultation bilaterally, heart is regular rate and rhythm without murmurs rubs or gallops, bowel sounds are normal, there is no guarding or rebound, there is no tenderness to palpation in any quadrant however patient states that his pain is worse around the umbilicus, or tenderness to palpation over McBurney's point, Rovsing is negative, psoas negative, obturator negative.  Patient's labs show no acute abnormalities there is mild hypokalemia at 3.2 patient received 20 mEq of potassium by mouth. Patient tolerating by mouth. I discussed physical exam and lab findings with the patient. I feel that is appropriate for discharge at this time. I have given return precautions for possible early appendicitis. Patient has verbalized his understanding.    New Prescriptions   OXYCODONE-ACETAMINOPHEN (PERCOCET/ROXICET) 5-325 MG PER TABLET    1 to 2 tabs PO q6hrs  PRN for pain     Wynetta Emery, PA-C 04/18/12 [phone]

## 2012-04-18 NOTE — ED Notes (Signed)
Pt reports he is returning to ED because he is still having abd pain and "was told to come back if he kept hurting." D/c at 0800 this am from ED. Denies n/v. Sts he got his Rx filled but took a "couple of his mom's meds because she has the same kind." Last dose at 0900 this am.
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
## 2012-04-18 NOTE — ED Provider Notes (Signed)
Medical screening examination/treatment/procedure(s) were performed by non-physician practitioner and as supervising physician I was immediately available for consultation/collaboration.  Alaylah Heatherington L Kizzie Cotten, MD 04/18/12 1533 

## 2012-04-18 NOTE — ED Notes (Signed)
Patient transported to CT 

## 2012-04-18 NOTE — ED Notes (Signed)
Pt advised that he was up for discharge and advised pt that he needed to get a ride home; pt states "No I can just drive"; Pt advised that he can drive due to having pain medication and that pt would not be discharged until a ride was seen.

## 2012-04-18 NOTE — ED Notes (Signed)
Report received from [facility]. Airway clear and intact. No distress noted. Pt waiting on a ride home,

## 2012-04-20 ENCOUNTER — Emergency Department (HOSPITAL_COMMUNITY): Payer: Self-pay

## 2012-04-20 ENCOUNTER — Emergency Department (HOSPITAL_COMMUNITY)
Admission: EM | Admit: 2012-04-20 | Discharge: 2012-04-20 | Disposition: A | Discharge status: Home Health | Payer: Self-pay | Attending: Emergency Medicine | Admitting: Emergency Medicine

## 2012-04-20 ENCOUNTER — Encounter (HOSPITAL_COMMUNITY): Payer: Self-pay | Admitting: Emergency Medicine

## 2012-04-20 DIAGNOSIS — K2970 Gastritis, unspecified, without bleeding: Secondary | ICD-10-CM | POA: Insufficient documentation

## 2012-04-20 DIAGNOSIS — Z8709 Personal history of other diseases of the respiratory system: Secondary | ICD-10-CM | POA: Insufficient documentation

## 2012-04-20 DIAGNOSIS — R109 Unspecified abdominal pain: Secondary | ICD-10-CM | POA: Insufficient documentation

## 2012-04-20 DIAGNOSIS — R110 Nausea: Secondary | ICD-10-CM | POA: Insufficient documentation

## 2012-04-20 DIAGNOSIS — F17200 Nicotine dependence, unspecified, uncomplicated: Secondary | ICD-10-CM | POA: Insufficient documentation

## 2012-04-20 DIAGNOSIS — Z87828 Personal history of other (healed) physical injury and trauma: Secondary | ICD-10-CM | POA: Insufficient documentation

## 2012-04-20 HISTORY — DX: Migraine, unspecified, not intractable, without status migrainosus: G43.909

## 2012-04-20 LAB — CBC WITH DIFFERENTIAL/PLATELET
Basophils Absolute: 0 10*3/uL (ref 0.0–0.1)
Eosinophils Relative: 3 % (ref 0–5)
HCT: 40.5 % (ref 39.0–52.0)
Hemoglobin: 13.5 g/dL (ref 13.0–17.0)
Lymphocytes Relative: 45 % (ref 12–46)
MCHC: 33.3 g/dL (ref 30.0–36.0)
MCV: 88.2 fL (ref 78.0–100.0)
Monocytes Absolute: 0.6 10*3/uL (ref 0.1–1.0)
Monocytes Relative: 9 % (ref 3–12)
Neutro Abs: 2.9 10*3/uL (ref 1.7–7.7)
RDW: 12.9 % (ref 11.5–15.5)
WBC: 6.8 10*3/uL (ref 4.0–10.5)

## 2012-04-20 LAB — COMPREHENSIVE METABOLIC PANEL
AST: 18 U/L (ref 0–37)
BUN: 9 mg/dL (ref 6–23)
CO2: 26 mEq/L (ref 19–32)
Calcium: 9.7 mg/dL (ref 8.4–10.5)
Chloride: 97 mEq/L (ref 96–112)
Creatinine, Ser: 1.03 mg/dL (ref 0.50–1.35)
GFR calc Af Amer: >90 mL/min (ref >90)
GFR calc non Af Amer: >90 mL/min (ref >90)
Total Bilirubin: 0.3 mg/dL (ref 0.3–1.2)

## 2012-04-20 LAB — URINALYSIS, ROUTINE W REFLEX MICROSCOPIC
Ketones, ur: NEGATIVE mg/dL
Leukocytes, UA: NEGATIVE
Nitrite: NEGATIVE
Protein, ur: NEGATIVE mg/dL
Urobilinogen, UA: 0.2 mg/dL (ref 0.0–1.0)

## 2012-04-20 LAB — LIPASE, BLOOD: Lipase: 31 U/L (ref 11–59)

## 2012-04-20 MED ORDER — GI COCKTAIL ~~LOC~~
30.0000 mL | Freq: Once | ORAL | Status: AC
  Administered 2012-04-20: 30 mL via ORAL
  Filled 2012-04-20: qty 30

## 2012-04-20 MED ORDER — HYDROMORPHONE HCL PF 1 MG/ML IJ SOLN
1.0000 mg | Freq: Once | INTRAMUSCULAR | Status: AC
  Administered 2012-04-20: 1 mg via INTRAVENOUS
  Filled 2012-04-20: qty 1

## 2012-04-20 MED ORDER — LORAZEPAM 1 MG PO TABS
1.0000 mg | ORAL_TABLET | Freq: Four times a day (QID) | ORAL | Status: DC | PRN
  Administered 2012-04-20: 1 mg via ORAL
  Filled 2012-04-20: qty 1

## 2012-04-20 MED ORDER — PANTOPRAZOLE SODIUM 40 MG IV SOLR
40.0000 mg | Freq: Once | INTRAVENOUS | Status: AC
  Administered 2012-04-20: 40 mg via INTRAVENOUS
  Filled 2012-04-20: qty 40

## 2012-04-20 MED ORDER — OMEPRAZOLE MAGNESIUM 20 MG PO TBEC: 20.0000 mg | DELAYED_RELEASE_TABLET | Freq: Every day | ORAL | Status: DC

## 2012-04-20 MED ORDER — LORAZEPAM 1 MG PO TABS: 1.0000 mg | ORAL_TABLET | Freq: Four times a day (QID) | ORAL | Status: DC | PRN

## 2012-04-20 MED ORDER — HYDROXYZINE HCL 25 MG PO TABS: 50.0000 mg | ORAL_TABLET | Freq: Once | ORAL | Status: DC

## 2012-04-20 MED ORDER — HYDROMORPHONE HCL PF 1 MG/ML IJ SOLN: 0.5000 mg | Freq: Once | INTRAMUSCULAR | Status: DC

## 2012-04-20 MED ORDER — SODIUM CHLORIDE 0.9 % IV BOLUS (SEPSIS)
1000.0000 mL | Freq: Once | INTRAVENOUS | Status: AC
  Administered 2012-04-20: 1000 mL via INTRAVENOUS

## 2012-04-20 MED ORDER — ADENOSINE 6 MG/2ML IV SOLN
INTRAVENOUS | Status: AC
  Filled 2012-04-20: qty 4

## 2012-04-20 MED ORDER — FLUOXETINE HCL 20 MG PO CAPS: 20.0000 mg | ORAL_CAPSULE | Freq: Every day | ORAL | Status: DC

## 2012-04-20 NOTE — ED Notes (Signed)
Pt discharged to home with family. NAD.  

## 2012-04-20 NOTE — ED Notes (Signed)
PT transferred from [facility] for assessment of ABD pain. On arrival to ED PT reports a 8/10 PT . Pt reports pain is continuos and is sharp and throbbing type of pain.

## 2012-04-20 NOTE — ED Provider Notes (Signed)
MSE was initiated and I personally evaluated the patient and placed orders (if any) at  7:21 AM on April 20, 2012.  Patient transferred for evaluation by his primary care team.  He appears in no distress, sitting quietly in bed. I spoke with EMS, who reported no notable events during transfer.  BP 141/95  Pulse 63  Temp(Src) 97.5 F (36.4 C) (Oral)  Resp 18  SpO2 100%   The patient appears stable so that the remainder of his evaluation will be conducted by his primary care team.  Gerhard Munch, MD 04/20/12 [phone]

## 2012-04-20 NOTE — ED Provider Notes (Signed)
Medical screening examination/treatment/procedure(s) were performed by non-physician practitioner and as supervising physician I was immediately available for consultation/collaboration.  Jones Skene, M.D.      Jones Skene, MD 04/20/12 1191

## 2012-04-20 NOTE — ED Notes (Signed)
Pt to US at this time.

## 2012-04-20 NOTE — ED Notes (Signed)
Pt alert, arrives from home, c/o cont abd pain, seen several times over last few days with same c/o, resp even unlabored, skin pwd

## 2012-04-20 NOTE — ED Notes (Signed)
PT reports the pain med. Dilaudid did not help his pain . He was trying to go to sleep.
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
## 2012-04-20 NOTE — Consult Note (Signed)
Discussed with Dr. Berline Chough and reviewed findings.  Agree with DC from ER with outpatient FU.
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
## 2012-04-21 LAB — URINE CULTURE: Colony Count: NO GROWTH

## 2012-04-21 LAB — GC/CHLAMYDIA PROBE AMP
CT Probe RNA: NEGATIVE
GC Probe RNA: NEGATIVE

## 2012-04-21 NOTE — ED Provider Notes (Signed)
Medical screening examination/treatment/procedure(s) were performed by non-physician practitioner and as supervising physician I was immediately available for consultation/collaboration.  Jandel Patriarca, MD 04/21/12 0003 

## 2012-04-21 NOTE — ED Provider Notes (Signed)
Medical screening examination/treatment/procedure(s) were performed by non-physician practitioner and as supervising physician I was immediately available for consultation/collaboration.  Sunnie Nielsen, MD 04/21/12 [phone]

## 2012-04-25 ENCOUNTER — Ambulatory Visit (INDEPENDENT_AMBULATORY_CARE_PROVIDER_SITE_OTHER): Payer: Self-pay | Admitting: Family Medicine

## 2012-04-25 ENCOUNTER — Encounter: Payer: Self-pay | Admitting: Family Medicine

## 2012-04-25 VITALS — BP 138/90 | HR 73 | Ht 73.0 in | Wt 201.0 lb

## 2012-04-25 DIAGNOSIS — R1033 Periumbilical pain: Secondary | ICD-10-CM

## 2012-04-25 DIAGNOSIS — Z0000 Encounter for general adult medical examination without abnormal findings: Secondary | ICD-10-CM

## 2012-04-25 DIAGNOSIS — R109 Unspecified abdominal pain: Secondary | ICD-10-CM

## 2012-04-25 NOTE — Addendum Note (Signed)
Addended by: Madolyn Frieze, Marylene Land J on: 04/25/2012 11:56 AM   Modules accepted: Orders

## 2012-04-25 NOTE — Progress Notes (Signed)
  Subjective:    Patient ID: Douglas Bridges, male    DOB: 1990/07/24, 24 y.o.   MRN: [id number]  HPI # ED follow-up for abdominal pain on 4/11.  He was discharged by our service from the ED as his work-up did not show worrisome findings, incluidng CT and CMET. His symptoms were thought secondary to anxiety and depression. He was given Rx for Prozac and BDZ to help bridge.   His stomach pain started on Tuesday 4/8. He went to the ED due to worsening pains.  Since leaving the ED, he had pastor pray for him 4/12. His pains improved.  He still get them. Pain around his belly button. Sharp, once or several times a day. May last all day or until he takes medication; his mother's Percocet, which does not make pain go away but helps it some.   He started fasting 3 weeks prior to 4/11 where he ate noodles and seafood during the week and then on Sunday he ate whatever he want, especially fast foot.   Review of Systems Denies diarrhea Endorses constipation; no bowel movement for the past 5 days  Denies increased stressors Denies penile discharge or anal warts  Allergies, medication, past medical history reviewed.  Smoking status noted. History of anal warts--s/p surgery     Objective:   Physical Exam GEN: NAD; well-nourished, -appearing PSYCH: not anxious or depressed appearing, appropriate to questions, fully alert and oriented CV: RRR PULM: NI WOB ABD: NABS, soft, mild-moderate tenderness around umbilicus  PHQ-9: 11/27    Assessment & Plan:

## 2012-04-25 NOTE — Assessment & Plan Note (Signed)
It may be improved since being in the ED 4/11. He was also in the ED 4/09 for the same reason.  Of note, he had been eating irregularly, "fasting" for the past 3 weeks prior where he would limit intake during the week but then binge on Sunday. He also seems to eat a lot of fast food on a regular basis.  -He was advised to keep a food diary and try a bland diet to see if this helps. He says he will do the food diary but is not sure he can stick to a bland diet.  -He has not started Prozac or Ativan prescribed in ED. Mood does not seem to be as significant a contributor to his abdominal pain. We will hold off on starting at this time.  -Do not take family member's percocet -He is constipated. Given dietary recommendations and then advised Miralax as needed.  -Follow-up in 1-2 weeks.  -Considered checking H. Pylori, HIV, TSH, however, he does not have insurance so we will defer at this time

## 2012-04-25 NOTE — Patient Instructions (Signed)
Try to watch what you eat. Keep a food diary and write down: 1. What you ate 2. When you get abdominal pain   Do this for 5 days.  Bring this to me.   Follow-up in 1-2 weeks.   For constipation: -Drink more water, eat more fiber (veggies, whole grains) -Miralax once a day; you may go up to twice a day if you are still constipated

## 2012-07-30 ENCOUNTER — Ambulatory Visit: Payer: Self-pay | Admitting: Family Medicine

## 2012-07-31 NOTE — Progress Notes (Unsigned)
PT NO SHOWED FOR HIS APPT FOR ORANGE CARD

## 2012-10-08 ENCOUNTER — Encounter (HOSPITAL_COMMUNITY): Payer: Self-pay | Admitting: *Deleted

## 2012-10-08 DIAGNOSIS — R51 Headache: Secondary | ICD-10-CM | POA: Insufficient documentation

## 2012-10-08 NOTE — ED Notes (Signed)
Pt c/o migraine headache since 5pm; history of same; no related symptoms besides headache

## 2012-10-09 ENCOUNTER — Emergency Department (HOSPITAL_COMMUNITY)
Admission: EM | Admit: 2012-10-09 | Discharge: 2012-10-09 | Discharge status: Against Medical Advice | Payer: Self-pay | Attending: Emergency Medicine | Admitting: Emergency Medicine

## 2013-08-20 ENCOUNTER — Emergency Department (HOSPITAL_COMMUNITY)
Admission: EM | Admit: 2013-08-20 | Discharge: 2013-08-20 | Disposition: A | Discharge status: Home / Self Care | Payer: Self-pay | Attending: Emergency Medicine | Admitting: Emergency Medicine

## 2013-08-20 ENCOUNTER — Encounter (HOSPITAL_COMMUNITY): Payer: Self-pay | Admitting: Emergency Medicine

## 2013-08-20 ENCOUNTER — Emergency Department (HOSPITAL_COMMUNITY): Payer: Self-pay

## 2013-08-20 DIAGNOSIS — F17200 Nicotine dependence, unspecified, uncomplicated: Secondary | ICD-10-CM | POA: Insufficient documentation

## 2013-08-20 DIAGNOSIS — S9000XA Contusion of unspecified ankle, initial encounter: Secondary | ICD-10-CM | POA: Insufficient documentation

## 2013-08-20 DIAGNOSIS — S99929A Unspecified injury of unspecified foot, initial encounter: Secondary | ICD-10-CM

## 2013-08-20 DIAGNOSIS — S99919A Unspecified injury of unspecified ankle, initial encounter: Secondary | ICD-10-CM

## 2013-08-20 DIAGNOSIS — S9001XA Contusion of right ankle, initial encounter: Secondary | ICD-10-CM

## 2013-08-20 DIAGNOSIS — G43909 Migraine, unspecified, not intractable, without status migrainosus: Secondary | ICD-10-CM | POA: Insufficient documentation

## 2013-08-20 DIAGNOSIS — Z8709 Personal history of other diseases of the respiratory system: Secondary | ICD-10-CM | POA: Insufficient documentation

## 2013-08-20 DIAGNOSIS — S8990XA Unspecified injury of unspecified lower leg, initial encounter: Secondary | ICD-10-CM | POA: Insufficient documentation

## 2013-08-20 MED ORDER — IBUPROFEN 600 MG PO TABS: 600.0000 mg | ORAL_TABLET | Freq: Four times a day (QID) | ORAL | Status: DC | PRN

## 2013-08-20 MED ORDER — IBUPROFEN 200 MG PO TABS
600.0000 mg | ORAL_TABLET | Freq: Once | ORAL | Status: AC
  Administered 2013-08-20: 600 mg via ORAL
  Filled 2013-08-20: qty 3

## 2013-08-20 NOTE — ED Provider Notes (Signed)
Medical screening examination/treatment/procedure(s) were performed by non-physician practitioner and as supervising physician I was immediately available for consultation/collaboration.    Linwood DibblesJon Cheyla Duchemin, MD 08/20/13 [phone]
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
## 2013-08-20 NOTE — Discharge Instructions (Signed)
Contusion A contusion is a deep bruise. Contusions happen when an injury causes bleeding under the skin. Signs of bruising include pain, puffiness (swelling), and discolored skin. The contusion may turn blue, purple, or yellow. HOME CARE   Put ice on the injured area.  Put ice in a plastic bag.  Place a towel between your skin and the bag.  Leave the ice on for 15-20 minutes, 03-04 times a day.  Only take medicine as told by your doctor.  Rest the injured area.  If possible, raise (elevate) the injured area to lessen puffiness. GET HELP RIGHT AWAY IF:   You have more bruising or puffiness.  You have pain that is getting worse.  Your puffiness or pain is not helped by medicine. MAKE SURE YOU:   Understand these instructions.  Will watch your condition.  Will get help right away if you are not doing well or get worse. Document Released: 06/15/2007 Document Revised: 03/21/2011 Document Reviewed: 11/01/2010 [facility] Patient Information 2015 [place], [place]. This information is not intended to replace advice given to you by your health care provider. Make sure you discuss any questions you have with your health care provider. Please wear the support for the next several days  Take the Ibuprofen on a regular basis for the same period of time

## 2013-09-23 ENCOUNTER — Encounter (HOSPITAL_COMMUNITY): Payer: Self-pay | Admitting: Emergency Medicine

## 2013-09-23 ENCOUNTER — Emergency Department (HOSPITAL_COMMUNITY)
Admission: EM | Admit: 2013-09-23 | Discharge: 2013-09-23 | Disposition: A | Discharge status: Home / Self Care | Payer: Self-pay | Attending: Emergency Medicine | Admitting: Emergency Medicine

## 2013-09-23 DIAGNOSIS — Z8679 Personal history of other diseases of the circulatory system: Secondary | ICD-10-CM | POA: Insufficient documentation

## 2013-09-23 DIAGNOSIS — Z8709 Personal history of other diseases of the respiratory system: Secondary | ICD-10-CM | POA: Insufficient documentation

## 2013-09-23 DIAGNOSIS — F17200 Nicotine dependence, unspecified, uncomplicated: Secondary | ICD-10-CM | POA: Insufficient documentation

## 2013-09-23 DIAGNOSIS — R197 Diarrhea, unspecified: Secondary | ICD-10-CM | POA: Insufficient documentation

## 2013-09-23 LAB — COMPREHENSIVE METABOLIC PANEL
ALK PHOS: 68 U/L (ref 39–117)
ALT: 19 U/L (ref 0–53)
ANION GAP: 12 (ref 5–15)
AST: 24 U/L (ref 0–37)
Albumin: 4.3 g/dL (ref 3.5–5.2)
BILIRUBIN TOTAL: 0.2 mg/dL — AB (ref 0.3–1.2)
BUN: 10 mg/dL (ref 6–23)
CHLORIDE: 102 meq/L (ref 96–112)
CO2: 26 mEq/L (ref 19–32)
Calcium: 10 mg/dL (ref 8.4–10.5)
Creatinine, Ser: 1.23 mg/dL (ref 0.50–1.35)
GFR calc Af Amer: >90 mL/min (ref >90)
GFR calc non Af Amer: 80 mL/min — ABNORMAL LOW (ref >90)
Glucose, Bld: 96 mg/dL (ref 70–99)
POTASSIUM: 4.2 meq/L (ref 3.7–5.3)
SODIUM: 140 meq/L (ref 137–147)
TOTAL PROTEIN: 8.1 g/dL (ref 6.0–8.3)

## 2013-09-23 LAB — CBC WITH DIFFERENTIAL/PLATELET
BASOS ABS: 0.1 10*3/uL (ref 0.0–0.1)
BASOS PCT: 1 % (ref 0–1)
EOS ABS: 0.2 10*3/uL (ref 0.0–0.7)
Eosinophils Relative: 2 % (ref 0–5)
HCT: 39.8 % (ref 39.0–52.0)
Hemoglobin: 13.2 g/dL (ref 13.0–17.0)
Lymphocytes Relative: 37 % (ref 12–46)
Lymphs Abs: 3 10*3/uL (ref 0.7–4.0)
MCH: 29.2 pg (ref 26.0–34.0)
MCHC: 33.2 g/dL (ref 30.0–36.0)
MCV: 88.1 fL (ref 78.0–100.0)
Monocytes Absolute: 0.6 10*3/uL (ref 0.1–1.0)
Monocytes Relative: 7 % (ref 3–12)
NEUTROS ABS: 4.4 10*3/uL (ref 1.7–7.7)
NEUTROS PCT: 53 % (ref 43–77)
PLATELETS: 355 10*3/uL (ref 150–400)
RBC: 4.52 MIL/uL (ref 4.22–5.81)
RDW: 13 % (ref 11.5–15.5)
WBC: 8.1 10*3/uL (ref 4.0–10.5)

## 2013-09-23 LAB — URINALYSIS, ROUTINE W REFLEX MICROSCOPIC
BILIRUBIN URINE: NEGATIVE
Glucose, UA: NEGATIVE mg/dL
HGB URINE DIPSTICK: NEGATIVE
Ketones, ur: NEGATIVE mg/dL
Leukocytes, UA: NEGATIVE
NITRITE: NEGATIVE
PH: 5.5 (ref 5.0–8.0)
Protein, ur: NEGATIVE mg/dL
Specific Gravity, Urine: 1.029 (ref 1.005–1.030)
UROBILINOGEN UA: 0.2 mg/dL (ref 0.0–1.0)

## 2013-09-23 LAB — LIPASE, BLOOD: Lipase: 26 U/L (ref 11–59)

## 2013-09-23 MED ORDER — DIPHENOXYLATE-ATROPINE 2.5-0.025 MG PO TABS: 1.0000 | ORAL_TABLET | Freq: Four times a day (QID) | ORAL | Status: DC | PRN

## 2013-09-23 MED ORDER — DIPHENOXYLATE-ATROPINE 2.5-0.025 MG PO TABS
1.0000 | ORAL_TABLET | Freq: Once | ORAL | Status: AC
  Administered 2013-09-23: 1 via ORAL
  Filled 2013-09-23: qty 1
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
## 2013-09-23 NOTE — ED Notes (Signed)
Patient is alert and oriented x3.  He was given DC instructions and follow up visit instructions.  Patient gave verbal understanding.  He was DC ambulatory under his own power to home.  V/S stable.  He was not showing any signs of distress on DC 

## 2013-09-23 NOTE — Discharge Instructions (Signed)
Drink plenty of fluids. Lomotil for diarrhea. Follow up with your doctor. Return if worsening.   Diarrhea Diarrhea is frequent loose and watery bowel movements. It can cause you to feel weak and dehydrated. Dehydration can cause you to become tired and thirsty, have a dry mouth, and have decreased urination that often is dark yellow. Diarrhea is a sign of another problem, most often an infection that will not last long. In most cases, diarrhea typically lasts 2-3 days. However, it can last longer if it is a sign of something more serious. It is important to treat your diarrhea as directed by your caregiver to lessen or prevent future episodes of diarrhea. CAUSES  Some common causes include:  Gastrointestinal infections caused by viruses, bacteria, or parasites.  Food poisoning or food allergies.  Certain medicines, such as antibiotics, chemotherapy, and laxatives.  Artificial sweeteners and fructose.  Digestive disorders. HOME CARE INSTRUCTIONS  Ensure adequate fluid intake (hydration): Have 1 cup (8 oz) of fluid for each diarrhea episode. Avoid fluids that contain simple sugars or sports drinks, fruit juices, whole milk products, and sodas. Your urine should be clear or pale yellow if you are drinking enough fluids. Hydrate with an oral rehydration solution that you can purchase at pharmacies, retail stores, and online. You can prepare an oral rehydration solution at home by mixing the following ingredients together:   - tsp table salt.   tsp baking soda.   tsp salt substitute containing potassium chloride.  1  tablespoons sugar.  1 L (34 oz) of water.  Certain foods and beverages may increase the speed at which food moves through the gastrointestinal (GI) tract. These foods and beverages should be avoided and include:  Caffeinated and alcoholic beverages.  High-fiber foods, such as raw fruits and vegetables, nuts, seeds, and whole grain breads and cereals.  Foods and beverages  sweetened with sugar alcohols, such as xylitol, sorbitol, and mannitol.  Some foods may be well tolerated and may help thicken stool including:  Starchy foods, such as rice, toast, pasta, low-sugar cereal, oatmeal, grits, baked potatoes, crackers, and bagels.  Bananas.  Applesauce.  Add probiotic-rich foods to help increase healthy bacteria in the GI tract, such as yogurt and fermented milk products.  Wash your hands well after each diarrhea episode.  Only take over-the-counter or prescription medicines as directed by your caregiver.  Take a warm bath to relieve any burning or pain from frequent diarrhea episodes. SEEK IMMEDIATE MEDICAL CARE IF:   You are unable to keep fluids down.  You have persistent vomiting.  You have blood in your stool, or your stools are black and tarry.  You do not urinate in 6-8 hours, or there is only a small amount of very dark urine.  You have abdominal pain that increases or localizes.  You have weakness, dizziness, confusion, or light-headedness.  You have a severe headache.  Your diarrhea gets worse or does not get better.  You have a fever or persistent symptoms for more than 2-3 days.  You have a fever and your symptoms suddenly get worse. MAKE SURE YOU:   Understand these instructions.  Will watch your condition.  Will get help right away if you are not doing well or get worse. Document Released: 12/17/2001 Document Revised: 05/13/2013 Document Reviewed: 09/04/2011 [facility] Patient Information 2015 [place], [place]. This information is not intended to replace advice given to you by your health care provider. Make sure you discuss any questions you have with your health care provider.

## 2013-09-23 NOTE — ED Notes (Signed)
Pt states he has been having diarrhea since Sunday morning. Denies n/v.

## 2013-09-23 NOTE — ED Provider Notes (Signed)
Medical screening examination/treatment/procedure(s) were performed by non-physician practitioner and as supervising physician I was immediately available for consultation/collaboration.   EKG Interpretation None        Tomasita Crumble, MD 09/23/13 1617

## 2013-10-02 ENCOUNTER — Emergency Department (HOSPITAL_COMMUNITY): Payer: No Typology Code available for payment source

## 2013-10-02 ENCOUNTER — Emergency Department (HOSPITAL_COMMUNITY)
Admission: EM | Admit: 2013-10-02 | Discharge: 2013-10-02 | Disposition: A | Discharge status: Home / Self Care | Payer: No Typology Code available for payment source | Attending: Emergency Medicine | Admitting: Emergency Medicine

## 2013-10-02 ENCOUNTER — Encounter (HOSPITAL_COMMUNITY): Payer: Self-pay | Admitting: Emergency Medicine

## 2013-10-02 DIAGNOSIS — Z8709 Personal history of other diseases of the respiratory system: Secondary | ICD-10-CM | POA: Insufficient documentation

## 2013-10-02 DIAGNOSIS — S298XXA Other specified injuries of thorax, initial encounter: Secondary | ICD-10-CM | POA: Insufficient documentation

## 2013-10-02 DIAGNOSIS — Y92410 Unspecified street and highway as the place of occurrence of the external cause: Secondary | ICD-10-CM | POA: Insufficient documentation

## 2013-10-02 DIAGNOSIS — S0993XA Unspecified injury of face, initial encounter: Secondary | ICD-10-CM | POA: Insufficient documentation

## 2013-10-02 DIAGNOSIS — Z8679 Personal history of other diseases of the circulatory system: Secondary | ICD-10-CM | POA: Insufficient documentation

## 2013-10-02 DIAGNOSIS — F17200 Nicotine dependence, unspecified, uncomplicated: Secondary | ICD-10-CM | POA: Insufficient documentation

## 2013-10-02 DIAGNOSIS — R072 Precordial pain: Secondary | ICD-10-CM

## 2013-10-02 DIAGNOSIS — M62838 Other muscle spasm: Secondary | ICD-10-CM

## 2013-10-02 DIAGNOSIS — S139XXA Sprain of joints and ligaments of unspecified parts of neck, initial encounter: Secondary | ICD-10-CM

## 2013-10-02 DIAGNOSIS — S199XXA Unspecified injury of neck, initial encounter: Secondary | ICD-10-CM

## 2013-10-02 DIAGNOSIS — Y9389 Activity, other specified: Secondary | ICD-10-CM | POA: Insufficient documentation

## 2013-10-02 MED ORDER — NAPROXEN 500 MG PO TABS: 500.0000 mg | ORAL_TABLET | Freq: Two times a day (BID) | ORAL | Status: DC

## 2013-10-02 MED ORDER — DIAZEPAM 5 MG PO TABS: 5.0000 mg | ORAL_TABLET | Freq: Two times a day (BID) | ORAL | Status: DC

## 2013-10-02 MED ORDER — HYDROCODONE-ACETAMINOPHEN 5-325 MG PO TABS: 1.0000 | ORAL_TABLET | Freq: Four times a day (QID) | ORAL | Status: DC | PRN

## 2013-10-02 MED ORDER — IBUPROFEN 800 MG PO TABS
800.0000 mg | ORAL_TABLET | Freq: Once | ORAL | Status: AC
  Administered 2013-10-02: 800 mg via ORAL
  Filled 2013-10-02: qty 1

## 2013-10-02 NOTE — ED Provider Notes (Signed)
Medical screening examination/treatment/procedure(s) were performed by non-physician practitioner and as supervising physician I was immediately available for consultation/collaboration.  Tristan Bramble T Sherryl Valido, MD 10/02/13 1540 
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
## 2013-10-02 NOTE — Progress Notes (Signed)
[facility] Community Liaison Stacy,  ° °Provided pt with a list of primary care resources and a [facility] Orange Card application to help patient establish primary care.  °

## 2013-10-06 ENCOUNTER — Encounter (HOSPITAL_COMMUNITY): Payer: Self-pay | Admitting: Emergency Medicine

## 2013-10-06 ENCOUNTER — Emergency Department (HOSPITAL_COMMUNITY)
Admission: EM | Admit: 2013-10-06 | Discharge: 2013-10-06 | Disposition: A | Discharge status: Home / Self Care | Payer: No Typology Code available for payment source | Attending: Emergency Medicine | Admitting: Emergency Medicine

## 2013-10-06 DIAGNOSIS — G43909 Migraine, unspecified, not intractable, without status migrainosus: Secondary | ICD-10-CM | POA: Insufficient documentation

## 2013-10-06 DIAGNOSIS — Z79899 Other long term (current) drug therapy: Secondary | ICD-10-CM | POA: Insufficient documentation

## 2013-10-06 DIAGNOSIS — F17200 Nicotine dependence, unspecified, uncomplicated: Secondary | ICD-10-CM | POA: Insufficient documentation

## 2013-10-06 DIAGNOSIS — R0789 Other chest pain: Secondary | ICD-10-CM

## 2013-10-06 DIAGNOSIS — Z8709 Personal history of other diseases of the respiratory system: Secondary | ICD-10-CM | POA: Insufficient documentation

## 2013-10-06 DIAGNOSIS — Z791 Long term (current) use of non-steroidal anti-inflammatories (NSAID): Secondary | ICD-10-CM | POA: Insufficient documentation

## 2013-10-06 DIAGNOSIS — M542 Cervicalgia: Secondary | ICD-10-CM

## 2013-10-06 DIAGNOSIS — G8911 Acute pain due to trauma: Secondary | ICD-10-CM | POA: Insufficient documentation

## 2013-10-06 DIAGNOSIS — R32 Unspecified urinary incontinence: Secondary | ICD-10-CM | POA: Insufficient documentation

## 2013-10-06 DIAGNOSIS — R071 Chest pain on breathing: Secondary | ICD-10-CM | POA: Insufficient documentation

## 2013-10-06 MED ORDER — METOCLOPRAMIDE HCL 10 MG PO TABS
10.0000 mg | ORAL_TABLET | Freq: Once | ORAL | Status: AC
  Administered 2013-10-06: 10 mg via ORAL
  Filled 2013-10-06: qty 1

## 2013-10-06 MED ORDER — TRAMADOL HCL 50 MG PO TABS
50.0000 mg | ORAL_TABLET | Freq: Once | ORAL | Status: AC
  Administered 2013-10-06: 50 mg via ORAL
  Filled 2013-10-06: qty 1

## 2013-10-06 MED ORDER — METOCLOPRAMIDE HCL 10 MG PO TABS: 10.0000 mg | ORAL_TABLET | Freq: Four times a day (QID) | ORAL | Status: DC | PRN

## 2013-10-06 MED ORDER — TRAMADOL HCL 50 MG PO TABS: 50.0000 mg | ORAL_TABLET | Freq: Four times a day (QID) | ORAL | Status: DC | PRN

## 2013-10-06 NOTE — Discharge Instructions (Signed)
Cervical Sprain °A cervical sprain is an injury in the neck in which the strong, fibrous tissues (ligaments) that connect your neck bones stretch or tear. Cervical sprains can range from mild to severe. Severe cervical sprains can cause the neck vertebrae to be unstable. This can lead to damage of the spinal cord and can result in serious nervous system problems. The amount of time it takes for a cervical sprain to get better depends on the cause and extent of the injury. Most cervical sprains heal in 1 to 3 weeks. °CAUSES  °Severe cervical sprains may be caused by:  °· Contact sport injuries (such as from football, rugby, wrestling, hockey, auto racing, gymnastics, diving, martial arts, or boxing).   °· Motor vehicle collisions.   °· Whiplash injuries. This is an injury from a sudden forward and backward whipping movement of the head and neck.  °· Falls.   °Mild cervical sprains may be caused by:  °· Being in an awkward position, such as while cradling a telephone between your ear and shoulder.   °· Sitting in a chair that does not offer proper support.   °· Working at a poorly designed computer station.   °· Looking up or down for long periods of time.   °SYMPTOMS  °· Pain, soreness, stiffness, or a burning sensation in the front, back, or sides of the neck. This discomfort may develop immediately after the injury or slowly, 24 hours or more after the injury.   °· Pain or tenderness directly in the middle of the back of the neck.   °· Shoulder or upper back pain.   °· Limited ability to move the neck.   °· Headache.   °· Dizziness.   °· Weakness, numbness, or tingling in the hands or arms.   °· Muscle spasms.   °· Difficulty swallowing or chewing.   °· Tenderness and swelling of the neck.   °DIAGNOSIS  °Most of the time your health care provider can diagnose a cervical sprain by taking your history and doing a physical exam. Your health care provider will ask about previous neck injuries and any known neck  problems, such as arthritis in the neck. X-rays may be taken to find out if there are any other problems, such as with the bones of the neck. Other tests, such as a CT scan or MRI, may also be needed.  °TREATMENT  °Treatment depends on the severity of the cervical sprain. Mild sprains can be treated with rest, keeping the neck in place (immobilization), and pain medicines. Severe cervical sprains are immediately immobilized. Further treatment is done to help with pain, muscle spasms, and other symptoms and may include: °· Medicines, such as pain relievers, numbing medicines, or muscle relaxants.   °· Physical therapy. This may involve stretching exercises, strengthening exercises, and posture training. Exercises and improved posture can help stabilize the neck, strengthen muscles, and help stop symptoms from returning.   °HOME CARE INSTRUCTIONS  °· Put ice on the injured area.   °¨ Put ice in a plastic bag.   °¨ Place a towel between your skin and the bag.   °¨ Leave the ice on for 15-20 minutes, 3-4 times a day.   °· If your injury was severe, you may have been given a cervical collar to wear. A cervical collar is a two-piece collar designed to keep your neck from moving while it heals. °¨ Do not remove the collar unless instructed by your health care provider. °¨ If you have long hair, keep it outside of the collar. °¨ Ask your health care provider before making any adjustments to your collar. Minor   adjustments may be required over time to improve comfort and reduce pressure on your chin or on the back of your head.  Ifyou are allowed to remove the collar for cleaning or bathing, follow your health care provider's instructions on how to do so safely.  Keep your collar clean by wiping it with mild soap and water and drying it completely. If the collar you have been given includes removable pads, remove them every 1-2 days and hand wash them with soap and water. Allow them to air dry. They should be completely  dry before you wear them in the collar.  If you are allowed to remove the collar for cleaning and bathing, wash and dry the skin of your neck. Check your skin for irritation or sores. If you see any, tell your health care provider.  Do not drive while wearing the collar.   Only take over-the-counter or prescription medicines for pain, discomfort, or fever as directed by your health care provider.   Keep all follow-up appointments as directed by your health care provider.   Keep all physical therapy appointments as directed by your health care provider.   Make any needed adjustments to your workstation to promote good posture.   Avoid positions and activities that make your symptoms worse.   Warm up and stretch before being active to help prevent problems.  SEEK MEDICAL CARE IF:   Your pain is not controlled with medicine.   You are unable to decrease your pain medicine over time as planned.   Your activity level is not improving as expected.  SEEK IMMEDIATE MEDICAL CARE IF:   You develop any bleeding.  You develop stomach upset.  You have signs of an allergic reaction to your medicine.   Your symptoms get worse.   You develop new, unexplained symptoms.   You have numbness, tingling, weakness, or paralysis in any part of your body.  MAKE SURE YOU:   Understand these instructions.  Will watch your condition.  Will get help right away if you are not doing well or get worse. Document Released: 10/24/2006 Document Revised: 01/01/2013 Document Reviewed: 07/04/2012 [facility] Patient Information 2015 [place], [place]. This information is not intended to replace advice given to you by your health care provider. Make sure you discuss any questions you have with your health care provider.  Chest Wall Pain Chest wall pain is pain in or around the bones and muscles of your chest. It may take up to 6 weeks to get better. It may take longer if you must stay physically  active in your work and activities.  CAUSES  Chest wall pain may happen on its own. However, it may be caused by:  A viral illness like the flu.  Injury.  Coughing.  Exercise.  Arthritis.  Fibromyalgia.  Shingles. HOME CARE INSTRUCTIONS   Avoid overtiring physical activity. Try not to strain or perform activities that cause pain. This includes any activities using your chest or your abdominal and side muscles, especially if heavy weights are used.  Put ice on the sore area.  Put ice in a plastic bag.  Place a towel between your skin and the bag.  Leave the ice on for 15-20 minutes per hour while awake for the first 2 days.  Only take over-the-counter or prescription medicines for pain, discomfort, or fever as directed by your caregiver. SEEK IMMEDIATE MEDICAL CARE IF:   Your pain increases, or you are very uncomfortable.  You have a fever.  Your chest  pain becomes worse.  You have new, unexplained symptoms.  You have nausea or vomiting.  You feel sweaty or lightheaded.  You have a cough with phlegm (sputum), or you cough up blood. MAKE SURE YOU:   Understand these instructions.  Will watch your condition.  Will get help right away if you are not doing well or get worse. Document Released: 12/27/2004 Document Revised: 03/21/2011 Document Reviewed: 08/23/2010 [facility] Patient Information 2015 [place], [place]. This information is not intended to replace advice given to you by your health care provider. Make sure you discuss any questions you have with your health care provider.
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
## 2013-10-09 ENCOUNTER — Encounter (HOSPITAL_COMMUNITY): Payer: Self-pay | Admitting: Emergency Medicine

## 2013-10-09 ENCOUNTER — Emergency Department (HOSPITAL_COMMUNITY)
Admission: EM | Admit: 2013-10-09 | Discharge: 2013-10-09 | Disposition: A | Discharge status: Home / Self Care | Payer: No Typology Code available for payment source | Attending: Emergency Medicine | Admitting: Emergency Medicine

## 2013-10-09 ENCOUNTER — Emergency Department (HOSPITAL_COMMUNITY): Payer: No Typology Code available for payment source

## 2013-10-09 DIAGNOSIS — H539 Unspecified visual disturbance: Secondary | ICD-10-CM | POA: Insufficient documentation

## 2013-10-09 DIAGNOSIS — Z791 Long term (current) use of non-steroidal anti-inflammatories (NSAID): Secondary | ICD-10-CM | POA: Insufficient documentation

## 2013-10-09 DIAGNOSIS — F17200 Nicotine dependence, unspecified, uncomplicated: Secondary | ICD-10-CM | POA: Insufficient documentation

## 2013-10-09 DIAGNOSIS — F0781 Postconcussional syndrome: Secondary | ICD-10-CM | POA: Insufficient documentation

## 2013-10-09 DIAGNOSIS — Z8679 Personal history of other diseases of the circulatory system: Secondary | ICD-10-CM | POA: Insufficient documentation

## 2013-10-09 DIAGNOSIS — M542 Cervicalgia: Secondary | ICD-10-CM | POA: Insufficient documentation

## 2013-10-09 DIAGNOSIS — R51 Headache: Secondary | ICD-10-CM | POA: Insufficient documentation

## 2013-10-09 DIAGNOSIS — Z8709 Personal history of other diseases of the respiratory system: Secondary | ICD-10-CM | POA: Insufficient documentation

## 2013-10-09 DIAGNOSIS — Z79899 Other long term (current) drug therapy: Secondary | ICD-10-CM | POA: Insufficient documentation

## 2013-10-09 MED ORDER — HYDROCODONE-ACETAMINOPHEN 5-325 MG PO TABS
1.0000 | ORAL_TABLET | Freq: Once | ORAL | Status: AC
  Administered 2013-10-09: 1 via ORAL
  Filled 2013-10-09: qty 1

## 2013-10-09 MED ORDER — HYDROCODONE-ACETAMINOPHEN 5-325 MG PO TABS: 1.0000 | ORAL_TABLET | Freq: Four times a day (QID) | ORAL | Status: DC | PRN
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
## 2013-10-09 NOTE — Discharge Instructions (Signed)
Post-Concussion Syndrome Post-concussion syndrome describes the symptoms that can occur after a head injury. These symptoms can last from weeks to months. CAUSES  It is not clear why some head injuries cause post-concussion syndrome. It can occur whether your head injury was mild or severe and whether you were wearing head protection or not.  SIGNS AND SYMPTOMS  Memory difficulties.  Dizziness.  Headaches.  Double vision or blurry vision.  Sensitivity to light.  Hearing difficulties.  Depression.  Tiredness.  Weakness.  Difficulty with concentration.  Difficulty sleeping or staying asleep.  Vomiting.  Poor balance or instability on your feet.  Slow reaction time.  Difficulty learning and remembering things you have heard. DIAGNOSIS  There is no test to determine whether you have post-concussion syndrome. Your health care provider may order an imaging scan of your brain, such as a CT scan, to check for other problems that may be causing your symptoms (such as severe injury inside your skull). TREATMENT  Usually, these problems disappear over time without medical care. Your health care provider may prescribe medicine to help ease your symptoms. It is important to follow up with a neurologist to evaluate your recovery and address any lingering symptoms or issues. HOME CARE INSTRUCTIONS   Only take over-the-counter or prescription medicines for pain, discomfort, or fever as directed by your health care provider. Do not take aspirin. Aspirin can slow blood clotting.  Sleep with your head slightly elevated to help with headaches.  Avoid any situation where there is potential for another head injury (football, hockey, soccer, basketball, martial arts, downhill snow sports, and horseback riding). Your condition will get worse every time you experience a concussion. You should avoid these activities until you are evaluated by the appropriate follow-up health care  providers.  Keep all follow-up appointments as directed by your health care provider. SEEK IMMEDIATE MEDICAL CARE IF:  You develop confusion or unusual drowsiness.  You cannot wake the injured person.  You develop nausea or persistent, forceful vomiting.  You feel like you are moving when you are not (vertigo).  You notice the injured person's eyes moving rapidly back and forth. This may be a sign of vertigo.  You have convulsions or faint.  You have severe, persistent headaches that are not relieved by medicine.  You cannot use your arms or legs normally.  Your pupils change size.  You have clear or bloody discharge from the nose or ears.  Your problems are getting worse, not better. MAKE SURE YOU:  Understand these instructions.  Will watch your condition.  Will get help right away if you are not doing well or get worse. Document Released: 06/18/2001 Document Revised: 10/17/2012 Document Reviewed: 04/03/2013 [employer] Patient Information 2015 [employer]. This information is not intended to replace advice given to you by your health care provider. Make sure you discuss any questions you have with your health care provider.  

## 2013-10-09 NOTE — ED Provider Notes (Signed)
Medical screening examination/treatment/procedure(s) were performed by non-physician practitioner and as supervising physician I was immediately available for consultation/collaboration.   EKG Interpretation None       Taleeya Blondin, MD 10/09/13 1644 
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
## 2013-11-15 ENCOUNTER — Emergency Department (HOSPITAL_COMMUNITY)
Admission: EM | Admit: 2013-11-15 | Discharge: 2013-11-15 | Disposition: A | Discharge status: Home / Self Care | Payer: No Typology Code available for payment source | Attending: Emergency Medicine | Admitting: Emergency Medicine

## 2013-11-15 ENCOUNTER — Encounter (HOSPITAL_COMMUNITY): Payer: Self-pay | Admitting: Emergency Medicine

## 2013-11-15 DIAGNOSIS — Z720 Tobacco use: Secondary | ICD-10-CM | POA: Insufficient documentation

## 2013-11-15 DIAGNOSIS — Z8709 Personal history of other diseases of the respiratory system: Secondary | ICD-10-CM | POA: Insufficient documentation

## 2013-11-15 DIAGNOSIS — Z791 Long term (current) use of non-steroidal anti-inflammatories (NSAID): Secondary | ICD-10-CM | POA: Insufficient documentation

## 2013-11-15 DIAGNOSIS — G43909 Migraine, unspecified, not intractable, without status migrainosus: Secondary | ICD-10-CM | POA: Insufficient documentation

## 2013-11-15 DIAGNOSIS — Z79899 Other long term (current) drug therapy: Secondary | ICD-10-CM | POA: Insufficient documentation

## 2013-11-15 DIAGNOSIS — H5310 Unspecified subjective visual disturbances: Secondary | ICD-10-CM | POA: Insufficient documentation

## 2013-11-15 NOTE — Discharge Instructions (Signed)
Blurred Vision Eye strain You have been seen today complaining of blurred vision. This means you have a loss of ability to see small details.  CAUSES  Blurred vision can be a symptom of underlying eye problems, such as:  Eye Strain  Aging of the eye (presbyopia).  Glaucoma.  Cataracts.  Eye infection.  Eye-related migraine.  Diabetes mellitus.  Fatigue.  Migraine headaches.  High blood pressure.  Breakdown of the back of the eye (macular degeneration).  Problems caused by some medications. The most common cause of blurred vision is the need for eyeglasses or a new prescription. Today in the emergency department, no cause for your blurred vision can be found. SYMPTOMS  Blurred vision is the loss of visual sharpness and detail (acuity). DIAGNOSIS  Should blurred vision continue, you should see your caregiver. If your caregiver is your primary care physician, he or she may choose to refer you to another specialist.  TREATMENT  Do not ignore your blurred vision. Make sure to have it checked out to see if further treatment or referral is necessary. SEEK MEDICAL CARE IF:  You are unable to get into a specialist so we can help you with a referral. SEEK IMMEDIATE MEDICAL CARE IF: You have severe eye pain, severe headache, or sudden loss of vision. MAKE SURE YOU:   Understand these instructions.  Will watch your condition.  Will get help right away if you are not doing well or get worse. Document Released: 12/30/2002 Document Revised: 03/21/2011 Document Reviewed: 08/01/2007 [facility] Patient Information 2015 [place], [place]. This information is not intended to replace advice given to you by your health care provider. Make sure you discuss any questions you have with your health care provider.   Emergency Department Resource Guide 1) Find a Doctor and Pay Out of Pocket Although you won't have to find out who is covered by your insurance plan, it is a good idea to ask around  and get recommendations. You will then need to call the office and see if the doctor you have chosen will accept you as a new patient and what types of options they offer for patients who are self-pay. Some doctors offer discounts or will set up payment plans for their patients who do not have insurance, but you will need to ask so you aren't surprised when you get to your appointment.  2) Contact Your Local Health Department Not all health departments have doctors that can see patients for sick visits, but many do, so it is worth a call to see if yours does. If you don't know where your local health department is, you can check in your phone book. The CDC also has a tool to help you locate your state's health department, and many state websites also have listings of all of their local health departments.  3) Find a Walk-in Clinic If your illness is not likely to be very severe or complicated, you may want to try a walk in clinic. These are popping up all over the country in pharmacies, drugstores, and shopping centers. They're usually staffed by nurse practitioners or physician assistants that have been trained to treat common illnesses and complaints. They're usually fairly quick and inexpensive. However, if you have serious medical issues or chronic medical problems, these are probably not your best option.  No Primary Care Doctor: - Call Health Connect at  [phone] - they can help you locate a primary care doctor that  accepts your insurance, provides certain services, etc. -  Physician Referral Service- [phone]  Chronic Pain Problems: Organization         Address  Phone   Notes  [facility]  [phone] Patients need to be referred by their primary care doctor.   Medication Assistance: Organization         Address  Phone   Notes  [facility] Medication [facility] [address] [place], [place] [address] [phone]) [phone] --Must be a resident  of [facility] [place] -- Must have NO insurance coverage whatsoever (no Medicaid/ Medicare, etc.) -- The pt. MUST have a primary care doctor that directs their care regularly and follows them in the community   MedAssist  [phone]   [employer]  [phone]) [phone]    Agencies that provide inexpensive medical care: Organization         Address  Phone   Notes  [facility]  [phone]) [phone]   [facility]    [phone]) [phone]   [facility] [address] [place], [place] [address] [phone]) [phone]   [facility] [address], [place] [phone]   Planned Parenthood    [phone]) [phone]   [facility]    [phone](336) [phone]   [facility]  [address], [place] Phone:  [phone], Fax:  [phone] Hours of Operation:  9 am - 6 pm, M-F.  Also accepts Medicaid/Medicare and self-pay.  [facility]  [address], [place] Phone: [phone]) [phone], Fax: [phone]) [phone]. Hours of Operation:  8:30 am - 5:30 pm, M-F.  Also accepts Medicaid and self-pay.  [facility] [place] [address], [place] Phone: [phone]) [phone]   [facility] [address], [place], [place] [phone], Ext. [phone] Mondays & Thursdays: 7-9 AM.  First 15 patients are seen on a first come, first serve basis.    Medicaid-accepting [facility] Providers:  Organization         Address  Phone   Notes  [facility] [address], [place] [phone] Also accepts self-pay patients.  [facility] [address], [place]  [phone]) [phone]   [facility] [address], [place] [phone]   [facility] [address] [place] [address], [place] [phone]) [phone]   Renaye RakersVeita Bland [address], [place]   [phone]) [phone] Only accepts [place]  Access [place] patients after they have their name applied to their card.   Self-Pay (no insurance) in [facility]:  Organization         Address  Phone   Notes  Sickle Cell Patients, [facility] [address], [place] [phone]) [phone]   [facility] [address], [place] [phone]) [phone]   [facility] [place]  [address], [place] [phone]) [phone]   [facility]/Dr. Osei-Bonsu  [address] [place] [address], [place] or [id number], [place] [phone] Phone number for both [place] and [place] locations is the same.  [facility] [address], [place] [phone]   [facility] [place] [address] [place] [address], [place] or [address] [phone]   [facility] [address], [place] [phone]) [phone],  phone; [phone](336) [phone], fax Sees patients 1st and 3rd Saturday of every month.  Must not qualify for public or private insurance (i.e. Medicaid, Medicare, [place] Health Choice, Veterans' Benefits)  Household income should be no more than 200% of the poverty level The clinic cannot treat you if you are pregnant or think you are pregnant  Sexually transmitted diseases are not treated at the clinic.    Dental Care: Organization         Address  Phone  Notes  [facility] of [facility] [address], [place] [phone]) [phone] Accepts children up to age 25 who are enrolled in [place]icaid or [place] Health Choice; pregnant women with a Medicaid card; and children who have applied for Medicaid or [place] Health Choice, but were declined, whose parents can pay a reduced fee at time of service.  [facility] Department of [facility] [place]  [address], [place] [phone]) [phone] Accepts children up to age 25 who are enrolled in [place]icaid or [place] Health Choice; pregnant women with a  Medicaid card; and children who have applied for Medicaid or [place] Health Choice, but were declined, whose parents can pay a reduced fee at time of service.  [facility]  [address], [place] [phone] Patients are seen by appointment only. Walk-ins are not accepted. [facility] will see patients 25 years of age and older. Monday - Tuesday (8am-5pm) Most Wednesdays (8:30-5pm) $30 per visit, cash only  [facility]  [address], [facility] [phone] Patients are seen by appointment only. Walk-ins are not accepted. [facility] will see patients 25 years of age and older. One Wednesday Evening (Monthly: Volunteer Based).  $30 per visit, cash only  [employer] [facility]  [phone]) [phone] for adults; Children under age 344, call Graduate Pediatric Dentistry at [phone]) [phone]. Children aged 24-14, please call [phone]) [phone] to request a pediatric application.  Dental services are provided in all areas of dental care including fillings, crowns and bridges, complete and partial dentures, implants, gum treatment, root canals, and extractions. Preventive care is also provided. Treatment is provided to both adults and children. Patients are selected via a lottery and there is often a waiting list.   [facility] [address], [place]  [phone]) [phone] [email or web address]   [facility] [address], [place], [place] [phone], Ext. [phone] Second and Fourth Thursday of each month, opens at 6:30 AM; Clinic ends at 9 AM.  Patients are seen on a first-come first-served basis, and a limited number are seen during each clinic.   [facility]  [address], [place], [place] [phone]) [phone]   Eligibility Requirements You must have lived in [place], [place], or [place] counties for at least the last three months.   You cannot be eligible for state or federal sponsored [employer]healthcare  insurance, including CIGNAVeterans Administration, [place]icaid, or Harrah[employer] EntertainmentMedicare.   You generally cannot be eligible for healthcare insurance through your employer.    How to apply: Eligibility screenings are held every Tuesday and Wednesday afternoon from 1:00 pm until 4:00 pm. You do not need an appointment for the interview!  [facility] [address], [place], [place] [phone]   [facility] Department  [phone]   [facility] Health Department  [phone]   [facility] Health Department  [phone]    Behavioral Health Resources in  the Community: Intensive Outpatient Programs Organization         Address  Phone  Notes  [facility] [address], [place], [place] [phone]   [facility] [address] [place], [place] [phone]   ADS: Alcohol [facility] Drug Svcs [address], [place], [place]  [phone]   [facility] [address]  [place], [place] [phone] or 2016327652   Substance Abuse Resources Organization         Address  Phone  Notes  Alcohol [facility] Drug Services  [phone]   [facility]  [phone]   [facility] [place]  [phone]   Floydene Flock  [phone]   Residential & Outpatient Substance Abuse Program  [phone]   Psychological Services Organization         Address  Phone  Notes  [facility]  [phone]   [facility] Services  [phone]   [facility] [address], [place] [phone] or [phone]    Mobile Crisis Teams Organization         Address  Phone  Notes  [facility], Mobile Crisis Care Unit  [phone]   [facility]otherapeutic [facility]  [address] [place], [place] [phone]   Doristine Locks [address] [place] [phone]    Self-Help/Support Groups Organization         Address  Phone             Notes  Mental  Health Assoc[facility] of [place] - variety of support groups  [phone]- I7437963 Call for more information  Narcotics Anonymous (NA), [facility] [address], [employer] [place]  2 meetings at this location   Statistician         Address  Phone  Notes  [facility] [address],    [place]  [phone]   [facility]  [address] [place] 7[address]242, [place], [place] [phone]   [facility] [address] [place], [place] [phone] Admissions: 8am-3pm M-F  [facility] [address],    [place], [place] [phone]   [facility] [address] [place], [place], [place] [phone]   [facility] [address]  [place], [place] [phone]   [facility] - Intensive Outpatient [address], [place], [place] [phone]   [facility] ([facility] [address] [place]  [place], [place] [phone] or [phone]   [facility] ([facility]) [address], [place], [place] [phone] Accepts Medicaid  [facility] [place] [address],  [place] [phone] Substance Abuse/Addiction Treatment   [facility] Organization         Address  Phone  Notes  [facility]  [phone]   Angie Fava, PhD [address] [place], [place]   [phone] or [phone]   [facility]   [address] [place], [place] [phone]   [facility] [address] [place], [place] [phone] Insurance/Medicaid/sponsorship through [employer] and Families [address]                                    [place], [place] [phone] Therapy/tele-psych/case  [facility] [address], [place] [phone]  Dr. Lolly Mustache  [phone]   [facility]  [employer] [facility] Dept. 1) [address]  [place] 2) [address] [place] 3)  [address], [place] [phone]  [phone]   [facility] Child Abuse Hotline 904-669-9424 or (410)675-6632 (After Hours)
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
## 2014-08-25 ENCOUNTER — Encounter (HOSPITAL_COMMUNITY): Payer: Self-pay | Admitting: Emergency Medicine

## 2014-08-25 ENCOUNTER — Emergency Department (HOSPITAL_COMMUNITY)
Admission: EM | Admit: 2014-08-25 | Discharge: 2014-08-25 | Disposition: A | Discharge status: Home / Self Care | Payer: Self-pay | Attending: Emergency Medicine | Admitting: Emergency Medicine

## 2014-08-25 ENCOUNTER — Emergency Department (HOSPITAL_COMMUNITY): Payer: Self-pay

## 2014-08-25 ENCOUNTER — Emergency Department (HOSPITAL_COMMUNITY)
Admission: EM | Admit: 2014-08-25 | Discharge: 2014-08-26 | Disposition: A | Discharge status: Home / Self Care | Payer: No Typology Code available for payment source | Attending: Emergency Medicine | Admitting: Emergency Medicine

## 2014-08-25 DIAGNOSIS — Y92410 Unspecified street and highway as the place of occurrence of the external cause: Secondary | ICD-10-CM | POA: Diagnosis not present

## 2014-08-25 DIAGNOSIS — Y9389 Activity, other specified: Secondary | ICD-10-CM | POA: Insufficient documentation

## 2014-08-25 DIAGNOSIS — Z720 Tobacco use: Secondary | ICD-10-CM | POA: Insufficient documentation

## 2014-08-25 DIAGNOSIS — S8992XA Unspecified injury of left lower leg, initial encounter: Secondary | ICD-10-CM | POA: Diagnosis not present

## 2014-08-25 DIAGNOSIS — G43909 Migraine, unspecified, not intractable, without status migrainosus: Secondary | ICD-10-CM | POA: Insufficient documentation

## 2014-08-25 DIAGNOSIS — S299XXA Unspecified injury of thorax, initial encounter: Secondary | ICD-10-CM | POA: Insufficient documentation

## 2014-08-25 DIAGNOSIS — Y998 Other external cause status: Secondary | ICD-10-CM | POA: Insufficient documentation

## 2014-08-25 DIAGNOSIS — S3992XA Unspecified injury of lower back, initial encounter: Secondary | ICD-10-CM | POA: Diagnosis not present

## 2014-08-25 DIAGNOSIS — M79605 Pain in left leg: Secondary | ICD-10-CM

## 2014-08-25 DIAGNOSIS — S7002XA Contusion of left hip, initial encounter: Secondary | ICD-10-CM

## 2014-08-25 DIAGNOSIS — S40022A Contusion of left upper arm, initial encounter: Secondary | ICD-10-CM

## 2014-08-25 DIAGNOSIS — S7012XA Contusion of left thigh, initial encounter: Secondary | ICD-10-CM | POA: Insufficient documentation

## 2014-08-25 DIAGNOSIS — S59902A Unspecified injury of left elbow, initial encounter: Secondary | ICD-10-CM | POA: Insufficient documentation

## 2014-08-25 DIAGNOSIS — S4992XA Unspecified injury of left shoulder and upper arm, initial encounter: Secondary | ICD-10-CM | POA: Insufficient documentation

## 2014-08-25 DIAGNOSIS — Z8709 Personal history of other diseases of the respiratory system: Secondary | ICD-10-CM | POA: Insufficient documentation

## 2014-08-25 MED ORDER — CYCLOBENZAPRINE HCL 5 MG PO TABS: 5.0000 mg | ORAL_TABLET | Freq: Three times a day (TID) | ORAL | Status: DC | PRN

## 2014-08-25 MED ORDER — KETOROLAC TROMETHAMINE 30 MG/ML IJ SOLN
60.0000 mg | Freq: Once | INTRAMUSCULAR | Status: AC
  Administered 2014-08-25: 60 mg via INTRAMUSCULAR
  Filled 2014-08-25: qty 2

## 2014-08-25 MED ORDER — NAPROXEN 500 MG PO TABS: ORAL_TABLET | ORAL | Status: DC
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
## 2014-08-26 MED ORDER — CYCLOBENZAPRINE HCL 10 MG PO TABS
10.0000 mg | ORAL_TABLET | Freq: Once | ORAL | Status: AC
  Administered 2014-08-26: 10 mg via ORAL
  Filled 2014-08-26: qty 1

## 2014-08-26 MED ORDER — KETOROLAC TROMETHAMINE 60 MG/2ML IM SOLN
60.0000 mg | Freq: Once | INTRAMUSCULAR | Status: AC
  Administered 2014-08-26: 60 mg via INTRAMUSCULAR
  Filled 2014-08-26: qty 2

## 2014-08-26 NOTE — Discharge Instructions (Signed)
Fill your prescriptions from yesterday. Refer to attached documents for more information.

## 2014-08-27 ENCOUNTER — Encounter (HOSPITAL_COMMUNITY): Payer: Self-pay | Admitting: *Deleted

## 2014-08-27 ENCOUNTER — Emergency Department (HOSPITAL_COMMUNITY): Payer: No Typology Code available for payment source

## 2014-08-27 ENCOUNTER — Emergency Department (HOSPITAL_COMMUNITY)
Admission: EM | Admit: 2014-08-27 | Discharge: 2014-08-27 | Disposition: A | Discharge status: Home / Self Care | Payer: Self-pay | Attending: Emergency Medicine | Admitting: Emergency Medicine

## 2014-08-27 DIAGNOSIS — M545 Low back pain, unspecified: Secondary | ICD-10-CM

## 2014-08-27 DIAGNOSIS — G43909 Migraine, unspecified, not intractable, without status migrainosus: Secondary | ICD-10-CM | POA: Insufficient documentation

## 2014-08-27 DIAGNOSIS — Z87828 Personal history of other (healed) physical injury and trauma: Secondary | ICD-10-CM | POA: Insufficient documentation

## 2014-08-27 DIAGNOSIS — Z8709 Personal history of other diseases of the respiratory system: Secondary | ICD-10-CM | POA: Insufficient documentation

## 2014-08-27 DIAGNOSIS — Z720 Tobacco use: Secondary | ICD-10-CM | POA: Insufficient documentation

## 2014-08-27 MED ORDER — NAPROXEN 500 MG PO TABS: 500.0000 mg | ORAL_TABLET | Freq: Once | ORAL | Status: DC

## 2014-08-27 NOTE — Discharge Instructions (Signed)
Take both medications that were prescribed to you at your initial visit as prescribed. Follow-up as previously directed. Rest, apply ice alternated with heat to your back.  Back Pain, Adult Low back pain is very common. About 1 in 5 people have back pain.The cause of low back pain is rarely dangerous. The pain often gets better over time.About half of people with a sudden onset of back pain feel better in just 2 weeks. About 8 in 10 people feel better by 6 weeks.  CAUSES Some common causes of back pain include:  Strain of the muscles or ligaments supporting the spine.  Wear and tear (degeneration) of the spinal discs.  Arthritis.  Direct injury to the back. DIAGNOSIS Most of the time, the direct cause of low back pain is not known.However, back pain can be treated effectively even when the exact cause of the pain is unknown.Answering your caregiver's questions about your overall health and symptoms is one of the most accurate ways to make sure the cause of your pain is not dangerous. If your caregiver needs more information, he or she may order lab work or imaging tests (X-rays or MRIs).However, even if imaging tests show changes in your back, this usually does not require surgery. HOME CARE INSTRUCTIONS For many people, back pain returns.Since low back pain is rarely dangerous, it is often a condition that people can learn to [facility] their own.   Remain active. It is stressful on the back to sit or stand in one place. Do not sit, drive, or stand in one place for more than 30 minutes at a time. Take short walks on level surfaces as soon as pain allows.Try to increase the length of time you walk each day.  Do not stay in bed.Resting more than 1 or 2 days can delay your recovery.  Do not avoid exercise or work.Your body is made to move.It is not dangerous to be active, even though your back may hurt.Your back will likely heal faster if you return to being active before your pain is  gone.  Pay attention to your body when you bend and lift. Many people have less discomfortwhen lifting if they bend their knees, keep the load close to their bodies,and avoid twisting. Often, the most comfortable positions are those that put less stress on your recovering back.  Find a comfortable position to sleep. Use a firm mattress and lie on your side with your knees slightly bent. If you lie on your back, put a pillow under your knees.  Only take over-the-counter or prescription medicines as directed by your caregiver. Over-the-counter medicines to reduce pain and inflammation are often the most helpful.Your caregiver may prescribe muscle relaxant drugs.These medicines help dull your pain so you can more quickly return to your normal activities and healthy exercise.  Put ice on the injured area.  Put ice in a plastic bag.  Place a towel between your skin and the bag.  Leave the ice on for 15-20 minutes, 03-04 times a day for the first 2 to 3 days. After that, ice and heat may be alternated to reduce pain and spasms.  Ask your caregiver about trying back exercises and gentle massage. This may be of some benefit.  Avoid feeling anxious or stressed.Stress increases muscle tension and can worsen back pain.It is important to recognize when you are anxious or stressed and learn ways to manage it.Exercise is a great option. SEEK MEDICAL CARE IF:  You have pain that is not relieved  with rest or medicine.  You have pain that does not improve in 1 week.  You have new symptoms.  You are generally not feeling well. SEEK IMMEDIATE MEDICAL CARE IF:   You have pain that radiates from your back into your legs.  You develop new bowel or bladder control problems.  You have unusual weakness or numbness in your arms or legs.  You develop nausea or vomiting.  You develop abdominal pain.  You feel faint. Document Released: 12/27/2004 Document Revised: 06/28/2011 Document Reviewed:  04/30/2013 [facility] Patient Information 2015 [place], [place]. This information is not intended to replace advice given to you by your health care provider. Make sure you discuss any questions you have with your health care provider.
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
## 2014-08-29 ENCOUNTER — Emergency Department (HOSPITAL_COMMUNITY): Payer: Self-pay

## 2014-08-29 ENCOUNTER — Emergency Department (HOSPITAL_COMMUNITY)
Admission: EM | Admit: 2014-08-29 | Discharge: 2014-08-29 | Disposition: A | Discharge status: Home / Self Care | Payer: Self-pay | Attending: Emergency Medicine | Admitting: Emergency Medicine

## 2014-08-29 ENCOUNTER — Encounter (HOSPITAL_COMMUNITY): Payer: Self-pay | Admitting: Emergency Medicine

## 2014-08-29 DIAGNOSIS — Z720 Tobacco use: Secondary | ICD-10-CM | POA: Insufficient documentation

## 2014-08-29 DIAGNOSIS — Z87828 Personal history of other (healed) physical injury and trauma: Secondary | ICD-10-CM | POA: Insufficient documentation

## 2014-08-29 DIAGNOSIS — M545 Low back pain, unspecified: Secondary | ICD-10-CM

## 2014-08-29 DIAGNOSIS — Z8709 Personal history of other diseases of the respiratory system: Secondary | ICD-10-CM | POA: Insufficient documentation

## 2014-08-29 DIAGNOSIS — Z8679 Personal history of other diseases of the circulatory system: Secondary | ICD-10-CM | POA: Insufficient documentation

## 2014-08-29 MED ORDER — TRAMADOL HCL 50 MG PO TABS: 50.0000 mg | ORAL_TABLET | Freq: Four times a day (QID) | ORAL | Status: DC | PRN
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
## 2014-08-29 NOTE — Discharge Instructions (Signed)
Given you a more potent pain medication.  Please fill this and take it as needed.  Make sure you keep your appointment with the orthopedic doctor on Monday.  Your x-ray is normal

## 2014-09-04 ENCOUNTER — Ambulatory Visit: Payer: No Typology Code available for payment source | Attending: Sports Medicine | Admitting: Physical Therapy

## 2014-09-04 DIAGNOSIS — M5386 Other specified dorsopathies, lumbar region: Secondary | ICD-10-CM

## 2014-09-04 DIAGNOSIS — M62830 Muscle spasm of back: Secondary | ICD-10-CM | POA: Insufficient documentation

## 2014-09-04 DIAGNOSIS — R293 Abnormal posture: Secondary | ICD-10-CM | POA: Insufficient documentation

## 2014-09-04 DIAGNOSIS — M545 Low back pain, unspecified: Secondary | ICD-10-CM

## 2014-09-04 DIAGNOSIS — M2560 Stiffness of unspecified joint, not elsewhere classified: Secondary | ICD-10-CM | POA: Insufficient documentation
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
## 2014-09-04 NOTE — Patient Instructions (Signed)
   Brylie Sneath PT, DPT, LAT, ATC  [facility] Phone: [phone]     

## 2014-09-23 ENCOUNTER — Ambulatory Visit: Payer: No Typology Code available for payment source | Attending: Sports Medicine | Admitting: Physical Therapy

## 2014-09-24 ENCOUNTER — Ambulatory Visit: Payer: No Typology Code available for payment source | Admitting: Physical Therapy

## 2014-09-26 ENCOUNTER — Emergency Department (HOSPITAL_COMMUNITY): Payer: No Typology Code available for payment source

## 2014-09-26 ENCOUNTER — Encounter (HOSPITAL_COMMUNITY): Payer: Self-pay | Admitting: *Deleted

## 2014-09-26 ENCOUNTER — Emergency Department (HOSPITAL_COMMUNITY)
Admission: EM | Admit: 2014-09-26 | Discharge: 2014-09-27 | Disposition: A | Discharge status: Home / Self Care | Payer: No Typology Code available for payment source | Attending: Emergency Medicine | Admitting: Emergency Medicine

## 2014-09-26 DIAGNOSIS — S79912A Unspecified injury of left hip, initial encounter: Secondary | ICD-10-CM | POA: Insufficient documentation

## 2014-09-26 DIAGNOSIS — Y92410 Unspecified street and highway as the place of occurrence of the external cause: Secondary | ICD-10-CM | POA: Insufficient documentation

## 2014-09-26 DIAGNOSIS — S3992XA Unspecified injury of lower back, initial encounter: Secondary | ICD-10-CM | POA: Insufficient documentation

## 2014-09-26 DIAGNOSIS — S3991XA Unspecified injury of abdomen, initial encounter: Secondary | ICD-10-CM | POA: Insufficient documentation

## 2014-09-26 DIAGNOSIS — T1490XA Injury, unspecified, initial encounter: Secondary | ICD-10-CM

## 2014-09-26 DIAGNOSIS — S0990XA Unspecified injury of head, initial encounter: Secondary | ICD-10-CM | POA: Diagnosis not present

## 2014-09-26 DIAGNOSIS — Y9389 Activity, other specified: Secondary | ICD-10-CM | POA: Insufficient documentation

## 2014-09-26 DIAGNOSIS — Z8709 Personal history of other diseases of the respiratory system: Secondary | ICD-10-CM | POA: Diagnosis not present

## 2014-09-26 DIAGNOSIS — Y998 Other external cause status: Secondary | ICD-10-CM | POA: Diagnosis not present

## 2014-09-26 DIAGNOSIS — Z8679 Personal history of other diseases of the circulatory system: Secondary | ICD-10-CM | POA: Insufficient documentation
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
## 2014-09-27 ENCOUNTER — Emergency Department (HOSPITAL_COMMUNITY): Payer: No Typology Code available for payment source

## 2014-09-27 LAB — COMPREHENSIVE METABOLIC PANEL
ALBUMIN: 4.1 g/dL (ref 3.5–5.0)
ALK PHOS: 55 U/L (ref 38–126)
ALT: 20 U/L (ref 17–63)
ANION GAP: 10 (ref 5–15)
AST: 29 U/L (ref 15–41)
BILIRUBIN TOTAL: 0.3 mg/dL (ref 0.3–1.2)
BUN: 7 mg/dL (ref 6–20)
CALCIUM: 9.4 mg/dL (ref 8.9–10.3)
CO2: 24 mmol/L (ref 22–32)
CREATININE: 1.05 mg/dL (ref 0.61–1.24)
Chloride: 105 mmol/L (ref 101–111)
GFR calc Af Amer: >60 mL/min (ref >60)
GFR calc non Af Amer: >60 mL/min (ref >60)
GLUCOSE: 105 mg/dL — AB (ref 65–99)
Potassium: 3.2 mmol/L — ABNORMAL LOW (ref 3.5–5.1)
Sodium: 139 mmol/L (ref 135–145)
TOTAL PROTEIN: 7.4 g/dL (ref 6.5–8.1)

## 2014-09-27 LAB — CBC WITH DIFFERENTIAL/PLATELET
BASOS PCT: 1 %
Basophils Absolute: 0.1 10*3/uL (ref 0.0–0.1)
Eosinophils Absolute: 0.2 10*3/uL (ref 0.0–0.7)
Eosinophils Relative: 3 %
HEMATOCRIT: 36.7 % — AB (ref 39.0–52.0)
HEMOGLOBIN: 12.5 g/dL — AB (ref 13.0–17.0)
Lymphocytes Relative: 50 %
Lymphs Abs: 4.1 10*3/uL — ABNORMAL HIGH (ref 0.7–4.0)
MCH: 29.5 pg (ref 26.0–34.0)
MCHC: 34.1 g/dL (ref 30.0–36.0)
MCV: 86.6 fL (ref 78.0–100.0)
MONOS PCT: 10 %
Monocytes Absolute: 0.8 10*3/uL (ref 0.1–1.0)
NEUTROS ABS: 2.9 10*3/uL (ref 1.7–7.7)
NEUTROS PCT: 36 %
Platelets: 338 10*3/uL (ref 150–400)
RBC: 4.24 MIL/uL (ref 4.22–5.81)
RDW: 12.9 % (ref 11.5–15.5)
WBC: 8 10*3/uL (ref 4.0–10.5)

## 2014-09-27 LAB — URINALYSIS, ROUTINE W REFLEX MICROSCOPIC
BILIRUBIN URINE: NEGATIVE
GLUCOSE, UA: NEGATIVE mg/dL
HGB URINE DIPSTICK: NEGATIVE
KETONES UR: NEGATIVE mg/dL
Leukocytes, UA: NEGATIVE
Nitrite: NEGATIVE
PH: 7 (ref 5.0–8.0)
Protein, ur: NEGATIVE mg/dL
SPECIFIC GRAVITY, URINE: 1.006 (ref 1.005–1.030)
Urobilinogen, UA: 0.2 mg/dL (ref 0.0–1.0)

## 2014-09-27 MED ORDER — HYDROCODONE-ACETAMINOPHEN 5-325 MG PO TABS: 1.0000 | ORAL_TABLET | ORAL | Status: DC | PRN

## 2014-09-27 MED ORDER — IOHEXOL 300 MG/ML  SOLN
100.0000 mL | Freq: Once | INTRAMUSCULAR | Status: AC | PRN
  Administered 2014-09-27: 100 mL via INTRAVENOUS

## 2014-09-27 MED ORDER — FENTANYL CITRATE (PF) 100 MCG/2ML IJ SOLN
50.0000 ug | Freq: Once | INTRAMUSCULAR | Status: AC
  Administered 2014-09-27: 50 ug via INTRAVENOUS
  Filled 2014-09-27: qty 2

## 2014-09-27 MED ORDER — METHOCARBAMOL 500 MG PO TABS: 1000.0000 mg | ORAL_TABLET | Freq: Three times a day (TID) | ORAL | Status: DC | PRN

## 2014-09-27 NOTE — ED Notes (Signed)
Pt transported to CT ?

## 2014-09-27 NOTE — Discharge Instructions (Signed)

## 2014-09-28 ENCOUNTER — Encounter (HOSPITAL_COMMUNITY): Payer: Self-pay | Admitting: Emergency Medicine

## 2014-09-28 ENCOUNTER — Emergency Department (HOSPITAL_COMMUNITY)
Admission: EM | Admit: 2014-09-28 | Discharge: 2014-09-28 | Disposition: A | Discharge status: Home / Self Care | Payer: No Typology Code available for payment source | Attending: Emergency Medicine | Admitting: Emergency Medicine

## 2014-09-28 DIAGNOSIS — Z87828 Personal history of other (healed) physical injury and trauma: Secondary | ICD-10-CM | POA: Diagnosis not present

## 2014-09-28 DIAGNOSIS — K088 Other specified disorders of teeth and supporting structures: Secondary | ICD-10-CM | POA: Diagnosis not present

## 2014-09-28 DIAGNOSIS — Z720 Tobacco use: Secondary | ICD-10-CM | POA: Insufficient documentation

## 2014-09-28 DIAGNOSIS — Z8709 Personal history of other diseases of the respiratory system: Secondary | ICD-10-CM | POA: Diagnosis not present

## 2014-09-28 DIAGNOSIS — M549 Dorsalgia, unspecified: Secondary | ICD-10-CM | POA: Insufficient documentation

## 2014-09-28 DIAGNOSIS — M25551 Pain in right hip: Secondary | ICD-10-CM

## 2014-09-28 DIAGNOSIS — K0889 Other specified disorders of teeth and supporting structures: Secondary | ICD-10-CM

## 2014-09-28 DIAGNOSIS — G43909 Migraine, unspecified, not intractable, without status migrainosus: Secondary | ICD-10-CM | POA: Diagnosis not present
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
## 2014-09-28 NOTE — Discharge Instructions (Signed)
°Emergency Department Resource Guide °1) Find a Doctor and Pay Out of Pocket °Although you won't have to find out who is covered by your insurance plan, it is a good idea to ask around and get recommendations. You will then need to call the office and see if the doctor you have chosen will accept you as a new patient and what types of options they offer for patients who are self-pay. Some doctors offer discounts or will set up payment plans for their patients who do not have insurance, but you will need to ask so you aren't surprised when you get to your appointment. ° °2) Contact Your Local Health Department °Not all health departments have doctors that can see patients for sick visits, but many do, so it is worth a call to see if yours does. If you don't know where your local health department is, you can check in your phone book. The CDC also has a tool to help you locate your state's health department, and many state websites also have listings of all of their local health departments. ° °3) Find a Walk-in Clinic °If your illness is not likely to be very severe or complicated, you may want to try a walk in clinic. These are popping up all over the country in pharmacies, drugstores, and shopping centers. They're usually staffed by nurse practitioners or physician assistants that have been trained to treat common illnesses and complaints. They're usually fairly quick and inexpensive. However, if you have serious medical issues or chronic medical problems, these are probably not your best option. ° °No Primary Care Doctor: °- Call [facility] at  832-8000 - they can help you locate a primary care doctor that  accepts your insurance, provides certain services, etc. °- Physician Referral Service- 1-800-533-3463 ° °Chronic Pain Problems: °Organization         Address  Phone   Notes  °[facility]  ([phone]) [phone] Patients need to be referred by their primary care doctor.  ° °Medication  Assistance: °Organization         Address  Phone   Notes  °[place] [facility] [address] °[place], [place] [address] (336) 641-8030 --Must be a resident of [place] °-- Must have NO insurance coverage whatsoever (no Medicaid/ Medicare, etc.) °-- The pt. MUST have a primary care doctor that directs their care regularly and follows them in the community °  °[facility]  (866) 331-1348   °[employer]  (888) 892-1162   ° °Agencies that provide inexpensive medical care: °Organization         Address  Phone   Notes  °[facility]  (336) 832-8035   °[facility]    (336) 832-7272   °[facility] [address] °[place], [place] [address] (336) 832-4777   °[facility] [address], °[place] (336) 271-4999   °[facility]    (336) 373-0678   °[facility]    (336) 272-1050   °[facility] ° [address], [place] Phone:  (336) 832-4444, Fax:  (336) 832-4440 Hours of Operation:  9 am - 6 pm, M-F.  Also accepts Medicaid/Medicare and self-pay.  °[facility] ° [address], [place] Phone: (336) [phone], Fax: (336) [phone]. Hours of Operation:  8:30 am - 5:30 pm, M-F.  Also accepts Medicaid and self-pay.  °[facility] [place] [address], [place] Phone: (336) 878-6027   °[facility] [address], [place], [place] (336)723-1848, Ext. 123 Mondays & Thursdays: 7-9 AM.  First 15 patients are seen on a first come, first serve basis. °  ° °Medicaid-accepting [place] Providers: ° °Organization         Address  Phone   Notes  °[facility] [address], [place] (336) 641-2100 Also accepts self-pay patients.  °[facility] [address], [place] ° (336) 856-9996   °[facility] [address], [place]  (336) 288-8857   °[facility] [address] [place] [address], [place] (336) 299-7000   °Veita Bland [address], [place]  ° (336) 373-1557 Only accepts [place] Access Medicaid patients after they have their name applied to their card.  ° °Self-Pay (no insurance) in [place]: ° °Organization         Address  Phone   Notes  °Sickle Cell Patients, [facility] [address], [place] (336) 832-1970   °[facility] [address], [place] (336) 832-4400   °[facility] ° [address], [place] (336) 992-4800   °[facility]/Dr. Osei-Bonsu ° [address] [place] [address], [place] or [address], [place] (336) [phone] Phone number for both [place] and [place] locations is the same.  °[facility] [address], [place] (336) 299-0000   °[facility] [address] [place] [address], [place] or [address] (336) 852-7530 °(336) 878-2260   °[facility] [address], [place] (336) 350-1642, phone; (336) 294-5005, fax Sees patients 1st and 3rd Saturday of every month.  Must not qualify for public or private insurance (i.e. Medicaid, Medicare, [place] Health Choice, Veterans' Benefits) • Household income should be no more than 200% of the poverty level •The clinic cannot treat you if you are pregnant or think you are pregnant • Sexually transmitted diseases are not treated at the clinic.  ° ° °Dental Care: °Organization         Address  Phone  Notes  °[place] [facility] [address], [place] (336) 641-6152 Accepts children up to age 21 who are enrolled in Medicaid or [place] Health Choice; pregnant women with a Medicaid card; and children who have applied for Medicaid or [place] Health Choice, but were declined, whose parents can pay a reduced fee at time of service.  °[place]  [facility] [place]  [address], [place] (336) 641-7733 Accepts children up to age 21 who are enrolled in Medicaid or [place] Health Choice; pregnant women with a Medicaid card; and children who have applied for Medicaid or [place] Health Choice, but were declined, whose parents can pay a reduced fee at time of service.  °[facility] ° [address], [place] (336) 641-4533 Patients are seen by appointment only. Walk-ins are not accepted. [facility] will see patients 18 years of age and older. °Monday - Tuesday (8am-5pm) °Most Wednesdays (8:30-5pm) °$30 per visit, cash only  °[facility] ° [address], [place] (336) 641-4533 Patients are seen by appointment only. Walk-ins are not accepted. [facility] will see patients 18 years of age and older. °One   Wednesday Evening (Monthly: Volunteer Based).  $30 per visit, cash only  °[facility]  (919) 537-3737 for adults; Children under age 4, call Graduate Pediatric Dentistry at ([phone]) [phone]. Children aged 4-14, please call (919) [phone] to request a pediatric application. ° Dental services are provided in all areas of dental care including fillings, crowns and bridges, complete and partial dentures, implants, gum treatment, root canals, and extractions. Preventive care is also provided. Treatment is provided to both adults and children. °Patients are selected via a lottery and there is often a waiting list. °  °[facility] [address], °[place] ° (336) 763-8833 [email or web address] °  °[facility] [address], [place], [place] (336)723-1848, Ext. 123 Second and Fourth Thursday of each month, opens at 6:30 AM; Clinic ends at 9 AM.  Patients are seen on a first-come first-served basis, and a limited number are seen during each clinic.  ° °[facility] ° [address], [place], [place] (336) 723-7904    Eligibility Requirements °You must have lived in [place], [place], or [place] counties for at least the last three months. °  You cannot be eligible for state or federal sponsored healthcare insurance, including Veterans Administration, Medicaid, or Medicare. °  You generally cannot be eligible for healthcare insurance through your employer.  °  How to apply: °Eligibility screenings are held every Tuesday and Wednesday afternoon from 1:00 pm until 4:00 pm. You do not need an appointment for the interview!  °[facility] [address], [place], [place] 336-631-2330   °[facility]  336-342-8273   °[facility]  336-703-3100   °[facility]  336-570-6415   ° °Behavioral Health Resources in the Community: °Intensive Outpatient Programs °Organization         Address  Phone  Notes  °[facility] [address], [place], [place] 336-878-6098   °[facility] [address], [place], [place] 336-832-9800   °[facility] [address], [place], [place] ° 336-882-2125   °[facility] [address],  °[place], [place] 1-800-853-5163 or 336-641-4981   °Substance Abuse Resources °Organization         Address  Phone  Notes  °[facility]  336-882-2125   °[facility]  336-784-9470   °[facility]  336-285-9073   °[facility]  336-845-3988   °Residential & Outpatient Substance Abuse Program  1-800-659-3381   °Psychological Services °Organization         Address  Phone  Notes  °[facility]  336- 832-9600   °[facility]  336- 378-7881   °[facility] [address], [place] 1-800-853-5163 or 336-641-4981   ° °Mobile Crisis Teams °Organization         Address  Phone  Notes  °[facility], Mobile Crisis Care Unit  1-877-626-1772   °Assertive °[facility] ° [address]  [place], [place] 336-834-9664   °Sharon DeEsch [address] °[place] 336-554-5454   ° °Self-Help/Support Groups °Organization         Address  Phone             Notes  °[facility]  - variety of support groups  336- 373-1402 Call for more information  °Narcotics Anonymous (NA), [facility] [address], °[place]  2 meetings at this location  ° °  Residential Treatment Programs Organization         Address  Phone  Notes  [facility] [address]    [place]  0-454-098-1191   [facility]  [address] [place] [address], [place], [place] 621-308-6578   [facility] [address] [place], [place] 469-629-5284 Admissions: 8am-3pm M-F  [facility] [address]    [place], [place] 132-440-1027   [facility] [address] [place], [place], [place] 253-664-4034   [facility] [address]  [place], [place] 742-595-6387   [facility] - Intensive Outpatient [address], [place], [place] 564-332-9518   [facility] [address] [place]  [place], [place] 8-416-606-3016 or 410-108-1022   [facility] [address] [place], [place] 322-025-4270 Accepts Medicaid  [facility] [address]  [place] 6-237-628-3151 Substance Abuse/Addiction Treatment   [facility] Organization         Address  Phone  Notes  [facility]  816-392-1832   Angie Fava, PhD [address] [place], [place]   5130616180 or [phone]   [facility]   [address] [place], [place] 312-109-9837   [facility] [address], [place], [place] [phone] Insurance/Medicaid/sponsorship through [facility] [address]                                    [place], [place] ([phone]) [phone] Therapy/tele-psych/case    [facility] [address], [place] [phone]    Dr. Lolly Mustache  ([phone]) [phone]   [facility]  [employer] [facility] 1) [address] [place] 2) [address] [place] 3)  [address], [place] (419) [phone]  [phone]   [facility] Child Abuse Hotline 712-395-7574 or 206-867-6784 (After Hours)     Please follow-up with your primary care provider for reevaluation in 1 week if symptoms continue to persist. Please follow up with dentist for dental care. Please continue using ibuprofen or Tylenol as needed for pain.

## 2014-09-29 ENCOUNTER — Ambulatory Visit: Payer: No Typology Code available for payment source | Admitting: Physical Therapy

## 2014-10-01 ENCOUNTER — Ambulatory Visit: Payer: No Typology Code available for payment source | Admitting: Physical Therapy

## 2014-10-06 ENCOUNTER — Emergency Department (HOSPITAL_COMMUNITY)
Admission: EM | Admit: 2014-10-06 | Discharge: 2014-10-07 | Disposition: A | Discharge status: Home / Self Care | Payer: No Typology Code available for payment source | Attending: Emergency Medicine | Admitting: Emergency Medicine

## 2014-10-06 ENCOUNTER — Ambulatory Visit: Payer: No Typology Code available for payment source | Admitting: Physical Therapy

## 2014-10-06 ENCOUNTER — Encounter (HOSPITAL_COMMUNITY): Payer: Self-pay | Admitting: Emergency Medicine

## 2014-10-06 DIAGNOSIS — Y9389 Activity, other specified: Secondary | ICD-10-CM | POA: Insufficient documentation

## 2014-10-06 DIAGNOSIS — S199XXA Unspecified injury of neck, initial encounter: Secondary | ICD-10-CM | POA: Diagnosis not present

## 2014-10-06 DIAGNOSIS — Y998 Other external cause status: Secondary | ICD-10-CM | POA: Insufficient documentation

## 2014-10-06 DIAGNOSIS — G43909 Migraine, unspecified, not intractable, without status migrainosus: Secondary | ICD-10-CM | POA: Insufficient documentation

## 2014-10-06 DIAGNOSIS — Y92410 Unspecified street and highway as the place of occurrence of the external cause: Secondary | ICD-10-CM | POA: Diagnosis not present

## 2014-10-06 DIAGNOSIS — Z8709 Personal history of other diseases of the respiratory system: Secondary | ICD-10-CM | POA: Diagnosis not present

## 2014-10-06 DIAGNOSIS — M25551 Pain in right hip: Secondary | ICD-10-CM

## 2014-10-06 DIAGNOSIS — S79911A Unspecified injury of right hip, initial encounter: Secondary | ICD-10-CM | POA: Insufficient documentation

## 2014-10-06 DIAGNOSIS — Z720 Tobacco use: Secondary | ICD-10-CM | POA: Insufficient documentation

## 2014-10-06 LAB — URINALYSIS, ROUTINE W REFLEX MICROSCOPIC
Bilirubin Urine: NEGATIVE
Glucose, UA: NEGATIVE mg/dL
Hgb urine dipstick: NEGATIVE
KETONES UR: NEGATIVE mg/dL
LEUKOCYTES UA: NEGATIVE
NITRITE: NEGATIVE
PROTEIN: NEGATIVE mg/dL
Specific Gravity, Urine: 1.023 (ref 1.005–1.030)
UROBILINOGEN UA: 1 mg/dL (ref 0.0–1.0)
pH: 6 (ref 5.0–8.0)
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
## 2014-10-07 ENCOUNTER — Emergency Department (HOSPITAL_COMMUNITY): Payer: No Typology Code available for payment source

## 2014-10-07 MED ORDER — IBUPROFEN 800 MG PO TABS
800.0000 mg | ORAL_TABLET | Freq: Once | ORAL | Status: AC
  Administered 2014-10-07: 800 mg via ORAL
  Filled 2014-10-07: qty 1

## 2014-10-07 MED ORDER — IBUPROFEN 800 MG PO TABS: 800.0000 mg | ORAL_TABLET | Freq: Three times a day (TID) | ORAL | Status: DC

## 2014-10-07 NOTE — ED Notes (Signed)
Patient transported to X-ray 
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
## 2014-10-07 NOTE — Discharge Instructions (Signed)
1. Medications: ibuprofen, muscle relaxer, usual home medications 2. Treatment: rest, drink plenty of fluids, ice, gentle stretching 3. Follow Up: please followup with your primary doctor for discussion of your diagnoses and further evaluation after today's visit; if you do not have a primary care doctor use the resource guide provided to find one; please return to the ER for new or worsening symptoms   Hip Pain Your hip is the joint between your upper legs and your lower pelvis. The bones, cartilage, tendons, and muscles of your hip joint perform a lot of work each day supporting your body weight and allowing you to move around. Hip pain can range from a minor ache to severe pain in one or both of your hips. Pain may be felt on the inside of the hip joint near the groin, or the outside near the buttocks and upper thigh. You may have swelling or stiffness as well.  HOME CARE INSTRUCTIONS   Take medicines only as directed by your health care provider.  Apply ice to the injured area:  Put ice in a plastic bag.  Place a towel between your skin and the bag.  Leave the ice on for 15-20 minutes at a time, 3-4 times a day.  Keep your leg raised (elevated) when possible to lessen swelling.  Avoid activities that cause pain.  Follow specific exercises as directed by your health care provider.  Sleep with a pillow between your legs on your most comfortable side.  Record how often you have hip pain, the location of the pain, and what it feels like. SEEK MEDICAL CARE IF:   You are unable to put weight on your leg.  Your hip is red or swollen or very tender to touch.  Your pain or swelling continues or worsens after 1 week.  You have increasing difficulty walking.  You have a fever. SEEK IMMEDIATE MEDICAL CARE IF:   You have fallen.  You have a sudden increase in pain and swelling in your hip. MAKE SURE YOU:   Understand these instructions.  Will watch your condition.  Will get  help right away if you are not doing well or get worse. Document Released: 06/16/2009 Document Revised: 05/13/2013 Document Reviewed: 08/23/2012 [facility] Patient Information 2015 [place], [place]. This information is not intended to replace advice given to you by your health care provider. Make sure you discuss any questions you have with your health care provider.  Musculoskeletal Pain Musculoskeletal pain is muscle and boney aches and pains. These pains can occur in any part of the body. Your caregiver may treat you without knowing the cause of the pain. They may treat you if blood or urine tests, X-rays, and other tests were normal.  CAUSES There is often not a definite cause or reason for these pains. These pains may be caused by a type of germ (virus). The discomfort may also come from overuse. Overuse includes working out too hard when your body is not fit. Boney aches also come from weather changes. Bone is sensitive to atmospheric pressure changes. HOME CARE INSTRUCTIONS   Ask when your test results will be ready. Make sure you get your test results.  Only take over-the-counter or prescription medicines for pain, discomfort, or fever as directed by your caregiver. If you were given medications for your condition, do not drive, operate machinery or power tools, or sign legal documents for 24 hours. Do not drink alcohol. Do not take sleeping pills or other medications that may interfere with  treatment.  Continue all activities unless the activities cause more pain. When the pain lessens, slowly resume normal activities. Gradually increase the intensity and duration of the activities or exercise.  During periods of severe pain, bed rest may be helpful. Lay or sit in any position that is comfortable.  Putting ice on the injured area.  Put ice in a bag.  Place a towel between your skin and the bag.  Leave the ice on for 15 to 20 minutes, 3 to 4 times a day.  Follow up with your caregiver  for continued problems and no reason can be found for the pain. If the pain becomes worse or does not go away, it may be necessary to repeat tests or do additional testing. Your caregiver may need to look further for a possible cause. SEEK IMMEDIATE MEDICAL CARE IF:  You have pain that is getting worse and is not relieved by medications.  You develop chest pain that is associated with shortness or breath, sweating, feeling sick to your stomach (nauseous), or throw up (vomit).  Your pain becomes localized to the abdomen.  You develop any new symptoms that seem different or that concern you. MAKE SURE YOU:   Understand these instructions.  Will watch your condition.  Will get help right away if you are not doing well or get worse. Document Released: 12/27/2004 Document Revised: 03/21/2011 Document Reviewed: 08/31/2012 [facility] Patient Information 2015 [place], [place]. This information is not intended to replace advice given to you by your health care provider. Make sure you discuss any questions you have with your health care provider.

## 2014-10-09 ENCOUNTER — Ambulatory Visit: Payer: No Typology Code available for payment source | Admitting: Physical Therapy

## 2014-10-09 ENCOUNTER — Emergency Department (HOSPITAL_COMMUNITY)
Admission: EM | Admit: 2014-10-09 | Discharge: 2014-10-09 | Disposition: A | Discharge status: Home / Self Care | Payer: Self-pay | Attending: Emergency Medicine | Admitting: Emergency Medicine

## 2014-10-09 ENCOUNTER — Telehealth: Payer: Self-pay | Admitting: Physical Therapy

## 2014-10-09 ENCOUNTER — Encounter (HOSPITAL_COMMUNITY): Payer: Self-pay

## 2014-10-09 DIAGNOSIS — M549 Dorsalgia, unspecified: Secondary | ICD-10-CM

## 2014-10-09 DIAGNOSIS — Z720 Tobacco use: Secondary | ICD-10-CM | POA: Insufficient documentation

## 2014-10-09 DIAGNOSIS — Z87828 Personal history of other (healed) physical injury and trauma: Secondary | ICD-10-CM | POA: Insufficient documentation

## 2014-10-09 DIAGNOSIS — Z8679 Personal history of other diseases of the circulatory system: Secondary | ICD-10-CM | POA: Insufficient documentation

## 2014-10-09 DIAGNOSIS — Z791 Long term (current) use of non-steroidal anti-inflammatories (NSAID): Secondary | ICD-10-CM | POA: Insufficient documentation

## 2014-10-09 DIAGNOSIS — M545 Low back pain: Secondary | ICD-10-CM | POA: Insufficient documentation

## 2014-10-09 DIAGNOSIS — Z8709 Personal history of other diseases of the respiratory system: Secondary | ICD-10-CM | POA: Insufficient documentation

## 2014-10-09 NOTE — Discharge Instructions (Signed)
Back Exercises Back exercises help treat and prevent back injuries. The goal of back exercises is to increase the strength of your abdominal and back muscles and the flexibility of your back. These exercises should be started when you no longer have back pain. Back exercises include:  Pelvic Tilt. Lie on your back with your knees bent. Tilt your pelvis until the lower part of your back is against the floor. Hold this position 5 to 10 sec and repeat 5 to 10 times.  Knee to Chest. Pull first 1 knee up against your chest and hold for 20 to 30 seconds, repeat this with the other knee, and then both knees. This may be done with the other leg straight or bent, whichever feels better.  Sit-Ups or Curl-Ups. Bend your knees 90 degrees. Start with tilting your pelvis, and do a partial, slow sit-up, lifting your trunk only 30 to 45 degrees off the floor. Take at least 2 to 3 seconds for each sit-up. Do not do sit-ups with your knees out straight. If partial sit-ups are difficult, simply do the above but with only tightening your abdominal muscles and holding it as directed.  Hip-Lift. Lie on your back with your knees flexed 90 degrees. Push down with your feet and shoulders as you raise your hips a couple inches off the floor; hold for 10 seconds, repeat 5 to 10 times.  Back arches. Lie on your stomach, propping yourself up on bent elbows. Slowly press on your hands, causing an arch in your low back. Repeat 3 to 5 times. Any initial stiffness and discomfort should lessen with repetition over time.  Shoulder-Lifts. Lie face down with arms beside your body. Keep hips and torso pressed to floor as you slowly lift your head and shoulders off the floor. Do not overdo your exercises, especially in the beginning. Exercises may cause you some mild back discomfort which lasts for a few minutes; however, if the pain is more severe, or lasts for more than 15 minutes, do not continue exercises until you see your caregiver.  Improvement with exercise therapy for back problems is slow.  See your caregivers for assistance with developing a proper back exercise program. Document Released: 02/04/2004 Document Revised: 03/21/2011 Document Reviewed: 10/28/2010 [facility] Patient Information 2015 [place], [place]. This information is not intended to replace advice given to you by your health care provider. Make sure you discuss any questions you have with your health care provider.  Back Injury Prevention The following tips can help you to prevent a back injury. PHYSICAL FITNESS  Exercise often. Try to develop strong stomach (abdominal) muscles.  Do aerobic exercises often. This includes walking, jogging, biking, swimming.  Do exercises that help with balance and strength often. This includes tai chi and yoga.  Stretch before and after you exercise.  Keep a healthy weight. DIET   Ask your doctor how much calcium and vitamin D you need every day.  Include calcium in your diet. Foods high in calcium include dairy products; green, leafy vegetables; and products with calcium added (fortified).  Include vitamin D in your diet. Foods high in vitamin D include milk and products with vitamin D added.  Think about taking a multivitamin or other nutritional products called " supplements."  Stop smoking if you smoke. POSTURE   Sit and stand up straight. Avoid leaning forward or hunching over.  Choose chairs that support your lower back.  If you work at a desk:  Sit close to your work so you  do not lean over.  Keep your chin tucked in.  Keep your neck drawn back.  Keep your elbows bent at a right angle. Your arms should look like the letter "L."  Sit high and close to the steering wheel when you drive. Add low back support to your car seat if needed.  Avoid sitting or standing in one position for too long. Get up and move around every hour. Take breaks if you are driving for a long time.  Sleep on your side  with your knees slightly bent. You can also sleep on your back with a pillow under your knees. Do not sleep on your stomach. LIFTING, TWISTING, AND REACHING  Avoid heavy lifting, especially lifting over and over again. If you must do heavy lifting:  Stretch before lifting.  Work slowly.  Rest between lifts.  Use carts and dollies to move objects when possible.  Make several small trips instead of carrying 1 heavy load.  Ask for help when you need it.  Ask for help when moving big, awkward objects.  Follow these steps when lifting:  Stand with your feet shoulder-width apart.  Get as close to the object as you can. Do not pick up heavy objects that are far from your body.  Use handles or lifting straps when possible.  Bend at your knees. Squat down, but keep your heels off the floor.  Keep your shoulders back, your chin tucked in, and your back straight.  Lift the object slowly. Tighten the muscles in your legs, stomach, and butt. Keep the object as close to the center of your body as possible.  Reverse these directions when you put a load down.  Do not:  Lift the object above your waist.  Twist at the waist while lifting or carrying a load. Move your feet if you need to turn, not your waist.  Bend over without bending at your knees.  Avoid reaching over your head, across a table, or for an object on a high surface. OTHER TIPS  Avoid wet floors and keep sidewalks clear of ice.  Do not sleep on a mattress that is too soft or too hard.  Keep items that you use often within easy reach.  Put heavier objects on shelves at waist level. Put lighter objects on lower or higher shelves.  Find ways to lessen your stress. You can try exercise, massage, or relaxation.  Get help for depression or anxiety if needed. GET HELP IF:  You injure your back.  You have questions about diet, exercise, or other ways to prevent back injuries. MAKE SURE YOU:  Understand these  instructions.  Will watch your condition.  Will get help right away if you are not doing well or get worse. Document Released: 06/15/2007 Document Revised: 03/21/2011 Document Reviewed: 02/07/2011 [facility] Patient Information 2015 [place], [place]. This information is not intended to replace advice given to you by your health care provider. Make sure you discuss any questions you have with your health care provider.  Back Pain, Adult Low back pain is very common. About 1 in 5 people have back pain.The cause of low back pain is rarely dangerous. The pain often gets better over time.About half of people with a sudden onset of back pain feel better in just 2 weeks. About 8 in 10 people feel better by 6 weeks.  CAUSES Some common causes of back pain include:  Strain of the muscles or ligaments supporting the spine.  Wear and tear (degeneration) of  the spinal discs.  Arthritis.  Direct injury to the back. DIAGNOSIS Most of the time, the direct cause of low back pain is not known.However, back pain can be treated effectively even when the exact cause of the pain is unknown.Answering your caregiver's questions about your overall health and symptoms is one of the most accurate ways to make sure the cause of your pain is not dangerous. If your caregiver needs more information, he or she may order lab work or imaging tests (X-rays or MRIs).However, even if imaging tests show changes in your back, this usually does not require surgery. HOME CARE INSTRUCTIONS For many people, back pain returns.Since low back pain is rarely dangerous, it is often a condition that people can learn to [facility] their own.   Remain active. It is stressful on the back to sit or stand in one place. Do not sit, drive, or stand in one place for more than 30 minutes at a time. Take short walks on level surfaces as soon as pain allows.Try to increase the length of time you walk each day.  Do not stay in bed.Resting more  than 1 or 2 days can delay your recovery.  Do not avoid exercise or work.Your body is made to move.It is not dangerous to be active, even though your back may hurt.Your back will likely heal faster if you return to being active before your pain is gone.  Pay attention to your body when you bend and lift. Many people have less discomfortwhen lifting if they bend their knees, keep the load close to their bodies,and avoid twisting. Often, the most comfortable positions are those that put less stress on your recovering back.  Find a comfortable position to sleep. Use a firm mattress and lie on your side with your knees slightly bent. If you lie on your back, put a pillow under your knees.  Only take over-the-counter or prescription medicines as directed by your caregiver. Over-the-counter medicines to reduce pain and inflammation are often the most helpful.Your caregiver may prescribe muscle relaxant drugs.These medicines help dull your pain so you can more quickly return to your normal activities and healthy exercise.  Put ice on the injured area.  Put ice in a plastic bag.  Place a towel between your skin and the bag.  Leave the ice on for 15-20 minutes, 03-04 times a day for the first 2 to 3 days. After that, ice and heat may be alternated to reduce pain and spasms.  Ask your caregiver about trying back exercises and gentle massage. This may be of some benefit.  Avoid feeling anxious or stressed.Stress increases muscle tension and can worsen back pain.It is important to recognize when you are anxious or stressed and learn ways to manage it.Exercise is a great option. SEEK MEDICAL CARE IF:  You have pain that is not relieved with rest or medicine.  You have pain that does not improve in 1 week.  You have new symptoms.  You are generally not feeling well. SEEK IMMEDIATE MEDICAL CARE IF:   You have pain that radiates from your back into your legs.  You develop new bowel or  bladder control problems.  You have unusual weakness or numbness in your arms or legs.  You develop nausea or vomiting.  You develop abdominal pain.  You feel faint. Document Released: 12/27/2004 Document Revised: 06/28/2011 Document Reviewed: 04/30/2013 [facility] Patient Information 2015 [place], [place]. This information is not intended to replace advice given to you by your health  care provider. Make sure you discuss any questions you have with your health care provider.
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
## 2014-10-09 NOTE — Telephone Encounter (Signed)
Left message regarding several no show appointments. Patient's future appointments have been canceled due to attendance policy. If he would like to reschedule he will be allowed to schedule only one appointment at a time.

## 2014-10-13 ENCOUNTER — Encounter: Payer: Self-pay | Admitting: Physical Therapy

## 2014-10-15 ENCOUNTER — Encounter: Payer: Self-pay | Admitting: Physical Therapy

## 2015-02-02 ENCOUNTER — Emergency Department (HOSPITAL_COMMUNITY)
Admission: EM | Admit: 2015-02-02 | Discharge: 2015-02-03 | Disposition: A | Discharge status: Home / Self Care | Payer: Self-pay | Attending: Emergency Medicine | Admitting: Emergency Medicine

## 2015-02-02 ENCOUNTER — Encounter (HOSPITAL_COMMUNITY): Payer: Self-pay | Admitting: Emergency Medicine

## 2015-02-02 DIAGNOSIS — R197 Diarrhea, unspecified: Secondary | ICD-10-CM | POA: Insufficient documentation

## 2015-02-02 DIAGNOSIS — F17210 Nicotine dependence, cigarettes, uncomplicated: Secondary | ICD-10-CM | POA: Insufficient documentation

## 2015-02-02 DIAGNOSIS — Z8709 Personal history of other diseases of the respiratory system: Secondary | ICD-10-CM | POA: Insufficient documentation

## 2015-02-02 DIAGNOSIS — Z87828 Personal history of other (healed) physical injury and trauma: Secondary | ICD-10-CM | POA: Insufficient documentation

## 2015-02-02 DIAGNOSIS — Z8679 Personal history of other diseases of the circulatory system: Secondary | ICD-10-CM | POA: Insufficient documentation

## 2015-02-02 LAB — COMPREHENSIVE METABOLIC PANEL WITH GFR
ALT: 18 U/L (ref 17–63)
AST: 23 U/L (ref 15–41)
Albumin: 4.6 g/dL (ref 3.5–5.0)
Alkaline Phosphatase: 61 U/L (ref 38–126)
Anion gap: 8 (ref 5–15)
BUN: 7 mg/dL (ref 6–20)
CO2: 27 mmol/L (ref 22–32)
Calcium: 9.7 mg/dL (ref 8.9–10.3)
Chloride: 105 mmol/L (ref 101–111)
Creatinine, Ser: 1.15 mg/dL (ref 0.61–1.24)
GFR calc Af Amer: >60 mL/min
GFR calc non Af Amer: >60 mL/min
Glucose, Bld: 96 mg/dL (ref 65–99)
Potassium: 3.9 mmol/L (ref 3.5–5.1)
Sodium: 140 mmol/L (ref 135–145)
Total Bilirubin: 0.5 mg/dL (ref 0.3–1.2)
Total Protein: 8.4 g/dL — ABNORMAL HIGH (ref 6.5–8.1)

## 2015-02-02 LAB — CBC
HCT: 42.1 % (ref 39.0–52.0)
Hemoglobin: 13.6 g/dL (ref 13.0–17.0)
MCH: 28.4 pg (ref 26.0–34.0)
MCHC: 32.3 g/dL (ref 30.0–36.0)
MCV: 87.9 fL (ref 78.0–100.0)
Platelets: 398 K/uL (ref 150–400)
RBC: 4.79 MIL/uL (ref 4.22–5.81)
RDW: 13.1 % (ref 11.5–15.5)
WBC: 7.5 K/uL (ref 4.0–10.5)

## 2015-02-02 LAB — LIPASE, BLOOD: Lipase: 27 U/L (ref 11–51)

## 2015-02-02 NOTE — ED Notes (Signed)
Patient presents for lower abdominal pain and 10+ episodes of diarrhea starting this morning. Denies N/V, fever or chills. Rates pain 8/10.

## 2015-02-03 LAB — URINALYSIS, ROUTINE W REFLEX MICROSCOPIC
BILIRUBIN URINE: NEGATIVE
Glucose, UA: NEGATIVE mg/dL
HGB URINE DIPSTICK: NEGATIVE
KETONES UR: NEGATIVE mg/dL
Leukocytes, UA: NEGATIVE
NITRITE: NEGATIVE
PROTEIN: NEGATIVE mg/dL
SPECIFIC GRAVITY, URINE: 1.018 (ref 1.005–1.030)
pH: 7 (ref 5.0–8.0)

## 2015-02-03 MED ORDER — SODIUM CHLORIDE 0.9 % IV BOLUS (SEPSIS)
1000.0000 mL | Freq: Once | INTRAVENOUS | Status: AC
  Administered 2015-02-03: 1000 mL via INTRAVENOUS

## 2015-02-03 MED ORDER — ONDANSETRON HCL 4 MG/2ML IJ SOLN
4.0000 mg | Freq: Once | INTRAMUSCULAR | Status: AC
  Administered 2015-02-03: 4 mg via INTRAVENOUS
  Filled 2015-02-03: qty 2

## 2015-02-03 MED ORDER — LOPERAMIDE HCL 2 MG PO CAPS
4.0000 mg | ORAL_CAPSULE | Freq: Once | ORAL | Status: AC
  Administered 2015-02-03: 4 mg via ORAL
  Filled 2015-02-03: qty 2

## 2015-02-03 NOTE — Discharge Instructions (Signed)
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
## 2015-07-15 ENCOUNTER — Encounter (HOSPITAL_COMMUNITY): Payer: Self-pay | Admitting: Emergency Medicine

## 2015-07-15 ENCOUNTER — Emergency Department (HOSPITAL_COMMUNITY): Payer: No Typology Code available for payment source

## 2015-07-15 ENCOUNTER — Emergency Department (HOSPITAL_COMMUNITY)
Admission: EM | Admit: 2015-07-15 | Discharge: 2015-07-15 | Disposition: A | Discharge status: Home / Self Care | Payer: No Typology Code available for payment source | Attending: Emergency Medicine | Admitting: Emergency Medicine

## 2015-07-15 DIAGNOSIS — S62309A Unspecified fracture of unspecified metacarpal bone, initial encounter for closed fracture: Secondary | ICD-10-CM

## 2015-07-15 DIAGNOSIS — S62334A Displaced fracture of neck of fourth metacarpal bone, right hand, initial encounter for closed fracture: Secondary | ICD-10-CM | POA: Insufficient documentation

## 2015-07-15 DIAGNOSIS — F17210 Nicotine dependence, cigarettes, uncomplicated: Secondary | ICD-10-CM | POA: Insufficient documentation

## 2015-07-15 DIAGNOSIS — Y999 Unspecified external cause status: Secondary | ICD-10-CM | POA: Insufficient documentation

## 2015-07-15 DIAGNOSIS — Y929 Unspecified place or not applicable: Secondary | ICD-10-CM | POA: Insufficient documentation

## 2015-07-15 DIAGNOSIS — Y9389 Activity, other specified: Secondary | ICD-10-CM | POA: Insufficient documentation

## 2015-07-15 MED ORDER — HYDROCODONE-ACETAMINOPHEN 5-325 MG PO TABS
1.0000 | ORAL_TABLET | Freq: Once | ORAL | Status: AC
  Administered 2015-07-15: 1 via ORAL
  Filled 2015-07-15: qty 1

## 2015-07-15 MED ORDER — HYDROCODONE-ACETAMINOPHEN 5-325 MG PO TABS: 1.0000 | ORAL_TABLET | Freq: Four times a day (QID) | ORAL | Status: DC | PRN

## 2015-07-15 NOTE — Discharge Instructions (Signed)
You have suffered a broken metacarpal bone in your right hand.  Wear splint but follow up with hand specialist next week for further management, which may includes a cast to your hand.  Take pain medication as needed but avoid driving or operate heavy machinery as the medication can cause drowsiness. No heavy lifting more than 5 lbs in your right hand until cleared by your hand specialist.   Metacarpal Fracture Fractures of metacarpals are breaks in the bones of the hand. They extend from the knuckles to the wrist. These bones can break in many ways. There are different ways of treating these fractures. HOME CARE  Only exercise as told by your doctor.  Return to activities as told by your doctor.  Go to physical therapy as told by your doctor.  Follow your doctor's advice about driving.  Keep the injured hand raised (elevated) above the level of your heart.  If a plaster, fiberglass, or pre-formed splint was applied:  Wear your splint as told and until you are examined again.  Apply ice on the injury for 15-20 minutes at a time, 03-04 times a day. Put the ice in a plastic bag. Place a towel between your skin and the bag.  Do not get your splint or cast wet. Protect it during bathing with a plastic bag.  Loosen the elastic bandage around the splint if your fingers start to get numb, tingle, get cold, or turn blue.  If the splint is plaster, do not lean it on hard surfaces or put pressure on it for 24 hours after it is put on.  Do not  try to scratch the skin under the cast.  Check the skin around the cast every day. You may put lotion on red or sore areas.  Move the fingers of your casted hand several times a day.  Only take medicine as told by your doctor.  Follow up as told by your doctor. This is very important in order to avoid permanent injury, disability, or lasting (chronic) pain. GET HELP RIGHT AWAY IF:   You develop a rash.  You have problems breathing.  You have  any allergy problems.  You have more than a small spot of blood from beneath your cast or splint.  You have redness, puffiness (swelling), or more pain from beneath your cast or splint.  Yellowish-white fluid (pus) comes from beneath your cast or splint.  You develop a temperature by mouth above 102 F (38.9 C), not controlled by medicine.  You have a bad smell coming from under your cast or splint.  You have problems moving any of your fingers. If you do not have a window in your cast for looking at the wound, a fluid or a little bleeding may show up as a stain on the outside of your cast. Tell your doctor about any stains you see. MAKE SURE YOU:   Understand these instructions.  Will watch your condition.  Will get help right away if you are not doing well or get worse.   This information is not intended to replace advice given to you by your health care provider. Make sure you discuss any questions you have with your health care provider.   Document Released: 06/15/2007 Document Revised: 01/17/2014 Document Reviewed: 10/16/2013 Elsevier Interactive Patient Education [employer]2016 [employer]vier Inc.
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
## 2015-07-15 NOTE — ED Notes (Signed)
Ortho at bedside.

## 2015-07-15 NOTE — ED Notes (Signed)
Ortho has been notified of splint.

## 2016-02-11 ENCOUNTER — Encounter (HOSPITAL_COMMUNITY): Payer: Self-pay

## 2016-02-11 ENCOUNTER — Emergency Department (HOSPITAL_COMMUNITY)
Admission: EM | Admit: 2016-02-11 | Discharge: 2016-02-12 | Disposition: A | Discharge status: Home / Self Care | Payer: Self-pay | Attending: Emergency Medicine | Admitting: Emergency Medicine

## 2016-02-11 DIAGNOSIS — I10 Essential (primary) hypertension: Secondary | ICD-10-CM | POA: Insufficient documentation

## 2016-02-11 DIAGNOSIS — F17290 Nicotine dependence, other tobacco product, uncomplicated: Secondary | ICD-10-CM | POA: Insufficient documentation

## 2016-02-11 DIAGNOSIS — G43009 Migraine without aura, not intractable, without status migrainosus: Secondary | ICD-10-CM | POA: Insufficient documentation

## 2016-02-11 MED ORDER — KETOROLAC TROMETHAMINE 15 MG/ML IJ SOLN
15.0000 mg | Freq: Once | INTRAMUSCULAR | Status: AC
  Administered 2016-02-11: 15 mg via INTRAVENOUS
  Filled 2016-02-11: qty 1

## 2016-02-11 MED ORDER — DIPHENHYDRAMINE HCL 50 MG/ML IJ SOLN
12.5000 mg | Freq: Once | INTRAMUSCULAR | Status: AC
  Administered 2016-02-11: 12.5 mg via INTRAVENOUS
  Filled 2016-02-11: qty 1

## 2016-02-11 MED ORDER — METOCLOPRAMIDE HCL 5 MG/ML IJ SOLN
10.0000 mg | Freq: Once | INTRAMUSCULAR | Status: AC
  Administered 2016-02-11: 10 mg via INTRAVENOUS
  Filled 2016-02-11: qty 2
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
## 2016-02-11 NOTE — ED Triage Notes (Signed)
Pt presents with c/o hypertension. Pt was [facility] and was given some medicine for his BP and advised to follow-up. Pt has an upcoming follow-up appointment for his BP but advised that today at [facility] his BP was still high. Pt c/o headache at this time as well.

## 2016-02-12 NOTE — Discharge Instructions (Signed)
FOLLOW UP PER YOUR SCHEDULED APPOINTMENT WITH YOUR NEW PRIMARY CARE PROVIDER FOR RECHECK OF BLOOD PRESSURE AND TREATMENT OF RECURRENT MIGRAINE HEADACHES.

## 2016-02-19 ENCOUNTER — Ambulatory Visit: Payer: Self-pay | Attending: Family Medicine | Admitting: Family Medicine

## 2016-02-19 ENCOUNTER — Other Ambulatory Visit: Payer: Self-pay | Admitting: Family Medicine

## 2016-02-19 ENCOUNTER — Encounter: Payer: Self-pay | Admitting: Family Medicine

## 2016-02-19 VITALS — BP 136/86 | HR 95 | Temp 97.9°F | Resp 18 | Ht 74.0 in | Wt 229.6 lb

## 2016-02-19 DIAGNOSIS — H6123 Impacted cerumen, bilateral: Secondary | ICD-10-CM

## 2016-02-19 DIAGNOSIS — I10 Essential (primary) hypertension: Secondary | ICD-10-CM | POA: Insufficient documentation

## 2016-02-19 DIAGNOSIS — G43709 Chronic migraine without aura, not intractable, without status migrainosus: Secondary | ICD-10-CM

## 2016-02-19 DIAGNOSIS — G43909 Migraine, unspecified, not intractable, without status migrainosus: Secondary | ICD-10-CM | POA: Insufficient documentation

## 2016-02-19 MED ORDER — HYDROCHLOROTHIAZIDE 25 MG PO TABS: 25.0000 mg | ORAL_TABLET | Freq: Every day | ORAL | 2 refills | Status: DC

## 2016-02-19 MED ORDER — BUTALBITAL-APAP-CAFFEINE 50-325-40 MG PO TABS: 1.0000 | ORAL_TABLET | Freq: Four times a day (QID) | ORAL | 0 refills | Status: DC | PRN

## 2016-02-19 MED ORDER — CARBAMIDE PEROXIDE 6.5 % OT SOLN: 10.0000 [drp] | Freq: Two times a day (BID) | OTIC | 0 refills | Status: DC

## 2016-02-19 MED FILL — BUTALB-ACETAMIN-CAFF 50-325: 50-325-40 | 10 days supply | Qty: 40 | Fill #0

## 2016-02-19 MED FILL — HYDROCHLOROTHIAZIDE 25 MG T: 25 | 30 days supply | Qty: 30 | Fill #0

## 2016-02-19 NOTE — Progress Notes (Signed)
Patient complains about bad headaches that's been more  Than a year pain level it goes up to a 10  Patient have not eaten today  Patient has not taking any meds today

## 2016-02-19 NOTE — Patient Instructions (Addendum)
Schedule an appointment for a physical in 2 weeks. Start keeping log of blood pressure to bring with you to next visit.    Hypertension Hypertension is another name for high blood pressure. High blood pressure forces your heart to work harder to pump blood. A blood pressure reading has two numbers, which includes a higher number over a lower number (example: 110/72). Follow these instructions at home:  Have your blood pressure rechecked by your doctor.  Only take medicine as told by your doctor. Follow the directions carefully. The medicine does not work as well if you skip doses. Skipping doses also puts you at risk for problems.  Do not smoke.  Monitor your blood pressure at home as told by your doctor. Contact a doctor if:  You think you are having a reaction to the medicine you are taking.  You have repeat headaches or feel dizzy.  You have puffiness (swelling) in your ankles.  You have trouble with your vision. Get help right away if:  You get a very bad headache and are confused.  You feel weak, numb, or faint.  You get chest or belly (abdominal) pain.  You throw up (vomit).  You cannot breathe very well. This information is not intended to replace advice given to you by your health care provider. Make sure you discuss any questions you have with your health care provider. Document Released: 06/15/2007 Document Revised: 06/04/2015 Document Reviewed: Dec 27, 202014 Elsevier Interactive Patient Education  2017 Elsevier Inc.  Migraine Headache A migraine headache is a very strong throbbing pain on one side or both sides of your head. Migraines can also cause other symptoms. Talk with your doctor about what things may bring on (trigger) your migraine headaches. Follow these instructions at home: Medicines  Take over-the-counter and prescription medicines only as told by your doctor.  Do not drive or use heavy machinery while taking prescription pain medicine.  To  prevent or treat constipation while you are taking prescription pain medicine, your doctor may recommend that you:  Drink enough fluid to keep your pee (urine) clear or pale yellow.  Take over-the-counter or prescription medicines.  Eat foods that are high in fiber. These include fresh fruits and vegetables, whole grains, and beans.  Limit foods that are high in fat and processed sugars. These include fried and sweet foods. Lifestyle  Avoid alcohol.  Do not use any products that contain nicotine or tobacco, such as cigarettes and e-cigarettes. If you need help quitting, ask your doctor.  Get at least 8 hours of sleep every night.  Limit your stress. General instructions  Keep a journal to find out what may bring on your migraines. For example, write down:  What you eat and drink.  How much sleep you get.  Any change in what you eat or drink.  Any change in your medicines.  If you have a migraine:  Avoid things that make your symptoms worse, such as bright lights.  It may help to lie down in a dark, quiet room.  Do not drive or use heavy machinery.  Ask your doctor what activities are safe for you.  Keep all follow-up visits as told by your doctor. This is important. Contact a doctor if:  You get a migraine that is different or worse than your usual migraines. Get help right away if:  Your migraine gets very bad.  You have a fever.  You have a stiff neck.  You have trouble seeing.  Your muscles feel weak  or like you cannot control them.  You start to lose your balance a lot.  You start to have trouble walking.  You pass out (faint). This information is not intended to replace advice given to you by your health care provider. Make sure you discuss any questions you have with your health care provider. Document Released: 10/06/2007 Document Revised: 07/17/2015 Document Reviewed: 06/15/2015 Elsevier Interactive Patient Education  2017 [employer].
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
## 2016-03-09 ENCOUNTER — Encounter: Payer: Self-pay | Admitting: Family Medicine

## 2016-03-16 ENCOUNTER — Encounter: Payer: Self-pay | Admitting: Internal Medicine

## 2016-04-06 ENCOUNTER — Other Ambulatory Visit: Payer: Self-pay | Admitting: Family Medicine

## 2016-04-06 ENCOUNTER — Ambulatory Visit: Payer: Self-pay | Attending: Family Medicine | Admitting: Family Medicine

## 2016-04-06 ENCOUNTER — Encounter: Payer: Self-pay | Admitting: Family Medicine

## 2016-04-06 VITALS — BP 143/101 | HR 85 | Temp 98.4°F | Resp 18 | Ht 74.0 in | Wt 243.0 lb

## 2016-04-06 DIAGNOSIS — Z0000 Encounter for general adult medical examination without abnormal findings: Secondary | ICD-10-CM

## 2016-04-06 DIAGNOSIS — Z114 Encounter for screening for human immunodeficiency virus [HIV]: Secondary | ICD-10-CM

## 2016-04-06 DIAGNOSIS — Z23 Encounter for immunization: Secondary | ICD-10-CM

## 2016-04-06 DIAGNOSIS — R030 Elevated blood-pressure reading, without diagnosis of hypertension: Secondary | ICD-10-CM | POA: Insufficient documentation

## 2016-04-06 DIAGNOSIS — R12 Heartburn: Secondary | ICD-10-CM

## 2016-04-06 DIAGNOSIS — H6123 Impacted cerumen, bilateral: Secondary | ICD-10-CM

## 2016-04-06 DIAGNOSIS — Z8249 Family history of ischemic heart disease and other diseases of the circulatory system: Secondary | ICD-10-CM | POA: Insufficient documentation

## 2016-04-06 DIAGNOSIS — F17290 Nicotine dependence, other tobacco product, uncomplicated: Secondary | ICD-10-CM | POA: Insufficient documentation

## 2016-04-06 DIAGNOSIS — Z833 Family history of diabetes mellitus: Secondary | ICD-10-CM | POA: Insufficient documentation

## 2016-04-06 DIAGNOSIS — G43909 Migraine, unspecified, not intractable, without status migrainosus: Secondary | ICD-10-CM | POA: Insufficient documentation

## 2016-04-06 DIAGNOSIS — J039 Acute tonsillitis, unspecified: Secondary | ICD-10-CM | POA: Insufficient documentation

## 2016-04-06 DIAGNOSIS — R0981 Nasal congestion: Secondary | ICD-10-CM | POA: Insufficient documentation

## 2016-04-06 LAB — POCT RAPID STREP A (OFFICE): Rapid Strep A Screen: NEGATIVE

## 2016-04-06 MED ORDER — CARBAMIDE PEROXIDE 6.5 % OT SOLN: 10.0000 [drp] | Freq: Two times a day (BID) | OTIC | 0 refills | Status: DC

## 2016-04-06 MED ORDER — OMEPRAZOLE 20 MG PO CPDR: 20.0000 mg | DELAYED_RELEASE_CAPSULE | Freq: Every day | ORAL | 1 refills | Status: DC

## 2016-04-06 MED ORDER — FLUTICASONE PROPIONATE 50 MCG/ACT NA SUSP: 2.0000 | Freq: Every day | NASAL | 0 refills | Status: DC

## 2016-04-06 MED ORDER — AMOXICILLIN 500 MG PO CAPS: 500.0000 mg | ORAL_CAPSULE | Freq: Two times a day (BID) | ORAL | 0 refills | Status: DC
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
## 2016-04-06 NOTE — Patient Instructions (Signed)
Schedule nurse visit for BP check . Please be sure to take your BP medication prior to visit You will be called with your labs results.   Tonsillitis Tonsillitis is an infection of the throat. This infection causes the tonsils to become red, tender, and swollen. Tonsils are tissues in the back of your throat. If bacteria caused your infection, antibiotic medicine will be given to you. Sometimes, symptoms of this infection can be helped with the use of steroid medicine. If your tonsillitis is very bad (severe) and happens often, you may need to get your tonsils removed (tonsillectomy). Follow these instructions at home: Medicines   Take over-the-counter and prescription medicines only as told by your doctor.  If you were prescribed an antibiotic, take it as told by your doctor. Do not stop taking the antibiotic even if you start to feel better. Eating and drinking   Drink enough fluid to keep your pee (urine) clear or pale yellow.  While your throat is sore, eat soft or liquid foods like:  Soup.  Sherbert.  Instant breakfast drinks.  Drink warm fluids.  Eat frozen ice pops. General instructions   Rest as much as possible and get plenty of sleep.  Gargle with a salt-water mixture 3-4 times a day or as needed. To make a salt-water mixture, completely dissolve -1 tsp of salt in 1 cup of warm water.  Wash your hands often with soap and water. If there is no soap and water, use hand sanitizer.  Do not share cups, bottles, or other utensils until your symptoms are gone.  Do not smoke. If you need help quitting, ask your doctor.  Keep all follow-up visits as told by your doctor. This is important. Contact a doctor if:  You have large, tender lumps in your neck.  You have a fever that does not go away after 2-3 days.  You have a rash.  You cough up green, yellow-brown, or bloody fluid.  You cannot swallow liquids or food for 24 hours.  Only one of your tonsils is  swollen. Get help right away if:  You have any new symptoms such as:  Vomiting  Very bad headache  Stiff neck  Chest pain  Trouble breathing or swallowing  You have very bad throat pain and also have drooling or voice changes.  You have very bad pain that is not helped by medicine.  You cannot fully open your mouth.  You have redness, swelling, or severe pain anywhere in your neck. Summary  Tonsillitis causes your tonsils to be red, tender, and swollen.  While your throat is sore eat soft or liquid foods.  Gargle with a salt-water mixture 3-4 times a day or as needed.  Do not share cups, bottles, or other utensils until your symptoms are gone. This information is not intended to replace advice given to you by your health care provider. Make sure you discuss any questions you have with your health care provider. Document Released: 06/15/2007 Document Revised: 06/04/2015 Document Reviewed: 06/15/2012 Elsevier Interactive Patient Education  2017 [employer]

## 2016-04-06 NOTE — Progress Notes (Signed)
Patient is here for physical  Patient has not taking his BP medication for today  Patient has not eaten for today  Patient denies pain

## 2016-04-07 LAB — COMPREHENSIVE METABOLIC PANEL WITH GFR
ALT: 26 IU/L (ref 0–44)
AST: 21 IU/L (ref 0–40)
Albumin/Globulin Ratio: 1.3 (ref 1.2–2.2)
Albumin: 4.2 g/dL (ref 3.5–5.5)
Alkaline Phosphatase: 60 IU/L (ref 39–117)
BUN/Creatinine Ratio: 9 (ref 9–20)
BUN: 8 mg/dL (ref 6–20)
Bilirubin Total: <0.2 mg/dL (ref 0.0–1.2)
CO2: 29 mmol/L (ref 18–29)
Calcium: 9.9 mg/dL (ref 8.7–10.2)
Chloride: 99 mmol/L (ref 96–106)
Creatinine, Ser: 0.88 mg/dL (ref 0.76–1.27)
GFR calc Af Amer: 135 mL/min/1.73
GFR calc non Af Amer: 117 mL/min/1.73
Globulin, Total: 3.2 g/dL (ref 1.5–4.5)
Glucose: 91 mg/dL (ref 65–99)
Potassium: 3.9 mmol/L (ref 3.5–5.2)
Sodium: 141 mmol/L (ref 134–144)
Total Protein: 7.4 g/dL (ref 6.0–8.5)

## 2016-04-07 LAB — CBC WITH DIFFERENTIAL/PLATELET
Basophils Absolute: 0.1 x10E3/uL (ref 0.0–0.2)
Basos: 1 %
EOS (ABSOLUTE): 0.5 x10E3/uL — ABNORMAL HIGH (ref 0.0–0.4)
Eos: 6 %
Hematocrit: 40.4 % (ref 37.5–51.0)
Hemoglobin: 13.2 g/dL (ref 13.0–17.7)
Immature Grans (Abs): 0 x10E3/uL (ref 0.0–0.1)
Immature Granulocytes: 0 %
Lymphocytes Absolute: 3.6 x10E3/uL — ABNORMAL HIGH (ref 0.7–3.1)
Lymphs: 47 %
MCH: 28.9 pg (ref 26.6–33.0)
MCHC: 32.7 g/dL (ref 31.5–35.7)
MCV: 89 fL (ref 79–97)
Monocytes Absolute: 0.8 x10E3/uL (ref 0.1–0.9)
Monocytes: 10 %
Neutrophils Absolute: 2.8 x10E3/uL (ref 1.4–7.0)
Neutrophils: 36 %
Platelets: 394 x10E3/uL — ABNORMAL HIGH (ref 150–379)
RBC: 4.56 x10E6/uL (ref 4.14–5.80)
RDW: 13.8 % (ref 12.3–15.4)
WBC: 7.7 x10E3/uL (ref 3.4–10.8)

## 2016-04-07 LAB — TSH: TSH: 3.27 u[IU]/mL (ref 0.450–4.500)

## 2016-04-07 LAB — HEMOGLOBIN A1C
Est. average glucose Bld gHb Est-mCnc: 126 mg/dL
Hgb A1c MFr Bld: 6 % — ABNORMAL HIGH (ref 4.8–5.6)

## 2016-04-07 LAB — VITAMIN D 25 HYDROXY (VIT D DEFICIENCY, FRACTURES): VIT D 25 HYDROXY: 4.7 ng/mL — AB (ref 30.0–100.0)

## 2016-04-07 LAB — LIPID PANEL
CHOLESTEROL TOTAL: 230 mg/dL — AB (ref 100–199)
Chol/HDL Ratio: 7.2 ratio units — ABNORMAL HIGH (ref 0.0–5.0)
HDL: 32 mg/dL — ABNORMAL LOW (ref >39)
LDL CALC: 118 mg/dL — AB (ref 0–99)
Triglycerides: 398 mg/dL — ABNORMAL HIGH (ref 0–149)
VLDL CHOLESTEROL CAL: 80 mg/dL — AB (ref 5–40)

## 2016-04-07 LAB — HIV ANTIBODY (ROUTINE TESTING W REFLEX): HIV Screen 4th Generation wRfx: NONREACTIVE

## 2016-04-09 LAB — CULTURE, GROUP A STREP: Strep A Culture: NEGATIVE

## 2016-04-11 ENCOUNTER — Other Ambulatory Visit: Payer: Self-pay | Admitting: Family Medicine

## 2016-04-11 ENCOUNTER — Telehealth: Payer: Self-pay

## 2016-04-11 DIAGNOSIS — R7303 Prediabetes: Secondary | ICD-10-CM

## 2016-04-11 DIAGNOSIS — E785 Hyperlipidemia, unspecified: Secondary | ICD-10-CM

## 2016-04-11 DIAGNOSIS — E559 Vitamin D deficiency, unspecified: Secondary | ICD-10-CM

## 2016-04-11 MED ORDER — VITAMIN D (ERGOCALCIFEROL) 1.25 MG (50000 UNIT) PO CAPS: 50000.0000 [IU] | ORAL_CAPSULE | ORAL | 0 refills | Status: DC

## 2016-04-11 NOTE — Telephone Encounter (Signed)
-----   Message from Lizbeth Bark, FNP sent at 04/11/2016  1:03 PM EDT ----- -HIV negative. - HgbA1c indicates your are pre-diabetic. HgbA1c gives an average of your blood sugar levels over the last 3 months.  -Start eating a carbohydrate modified diet. Avoid eating large amounts of starches, white bread, rice, and sugar. Recheck in 3 mths. -Lipid levels were elevated. This can increase your risk of heart disease. -Start eating a diet low in saturated fat. Limit your intake of fried foods, red meats, and whole milk. Begin exercising at least 3-5 times per week for 30 minutes. Recheck in 3 mths. Vitamin D level was very low. Vitamin D helps to keep bones strong. You were prescribed ergocalciferol (capsules) to increase your vitamin-d level. Once finished start taking OTC vitamin d supplement with 800 international units (IU) of vitamin-d per day. Recheck in 3 mths. -Liver function normal -Thyroid function normal -Kidney function normal

## 2016-04-11 NOTE — Telephone Encounter (Signed)
CMA call to inform patient about lab results  Patient Verify DOB  Patient was aware and understood   

## 2016-04-12 ENCOUNTER — Telehealth: Payer: Self-pay | Admitting: Family Medicine

## 2016-04-12 NOTE — Telephone Encounter (Signed)
Patient called the office to speak with nurse in regards to his lab results specifically his diabetes. Please follow up.  Thank you.

## 2016-04-12 NOTE — Telephone Encounter (Signed)
CMA return patient call  Patient wanted more information about his results  Patient did understood this time

## 2016-04-13 ENCOUNTER — Encounter: Payer: Self-pay | Admitting: Family Medicine

## 2016-04-21 ENCOUNTER — Ambulatory Visit: Payer: Self-pay | Attending: Family Medicine | Admitting: *Deleted

## 2016-04-21 VITALS — BP 135/90 | HR 92

## 2016-04-21 DIAGNOSIS — I10 Essential (primary) hypertension: Secondary | ICD-10-CM | POA: Insufficient documentation

## 2016-04-21 MED ORDER — AMLODIPINE BESYLATE 5 MG PO TABS: 5.0000 mg | ORAL_TABLET | Freq: Every day | ORAL | 3 refills | Status: DC

## 2016-04-21 MED ORDER — HYDROCHLOROTHIAZIDE 25 MG PO TABS: 25.0000 mg | ORAL_TABLET | Freq: Every day | ORAL | 1 refills | Status: DC

## 2016-04-21 MED FILL — AMLODIPINE BESYLATE 5 MG TA: 5 | 30 days supply | Qty: 30 | Fill #0

## 2016-04-21 MED FILL — HYDROCHLOROTHIAZIDE 25 MG T: 25 | 30 days supply | Qty: 30 | Fill #0

## 2016-04-21 NOTE — Progress Notes (Signed)
Pt here for f/u BP check. He c/o continued migraines, blurred vision, and SOB. He states he is taking medication daily for BP and HA. He has not taken ATB, amoxicillin.   BP taken: 130/92 and 135/90 HR: 92  Medication added per OV note 04/06/16 of M. Hairston, FNP: Add Amlodipine 5 mg daily in BP not less than 130/80. Pt aware to f/u in 2 weeks for BP check. Encouraged patient to increase fruit and vegetable intake and decrease salt intake, fried foods eaten out. Encourage to exercise 3 times weekly.   Apt made 05/05/16@ 11a.

## 2016-04-21 NOTE — Patient Instructions (Addendum)
Return in 2 weeks for BP check Take new medication Amlodipine in  Addition to HCTZ

## 2016-05-13 ENCOUNTER — Ambulatory Visit: Payer: Self-pay | Attending: Family Medicine | Admitting: *Deleted

## 2016-05-13 VITALS — BP 130/90 | HR 97

## 2016-05-13 DIAGNOSIS — I10 Essential (primary) hypertension: Secondary | ICD-10-CM | POA: Insufficient documentation

## 2016-05-13 NOTE — Progress Notes (Signed)
[facility]  Pt is alert and oriented. NAD noted.   Has right foot pain, no swelling. Pain 6/10 throughout. Pain ongoing since March and progressed over several days.    Pt denies chest pain, SOB, HA, blurred vision or swelling. Pt states he is taking BP medication as prescribed.  BP taken manually: 130/100 and 130/90. Pt admits that he eats out a lot and eats a lot of carbohydrates and salty foods. Pt educated on carbohydrates and salt intake as it relates to diabetes and heart disease. Encouraged patient to exercise daily. He states he does not have energy. Recommended pt pick up prescription from pharmacy: Vitamin D.   Pt verbalized understanding.    Will route nurse visit to provider for f/u.

## 2016-05-16 ENCOUNTER — Ambulatory Visit: Payer: Self-pay | Admitting: Family Medicine

## 2016-05-17 ENCOUNTER — Encounter (INDEPENDENT_AMBULATORY_CARE_PROVIDER_SITE_OTHER): Payer: Self-pay | Admitting: *Deleted

## 2016-05-17 VITALS — BP 125/89 | HR 66 | Temp 98.0°F | Ht 73.25 in | Wt 230.8 lb

## 2016-05-17 DIAGNOSIS — Z006 Encounter for examination for normal comparison and control in clinical research program: Secondary | ICD-10-CM

## 2016-05-17 LAB — CBC WITH DIFFERENTIAL/PLATELET
BASOS ABS: 49 {cells}/uL (ref 0–200)
BASOS PCT: 1 %
EOS ABS: 147 {cells}/uL (ref 15–500)
Eosinophils Relative: 3 %
HEMATOCRIT: 40.9 % (ref 38.5–50.0)
Hemoglobin: 13.4 g/dL (ref 13.2–17.1)
LYMPHS PCT: 40 %
Lymphs Abs: 1960 cells/uL (ref 850–3900)
MCH: 28.8 pg (ref 27.0–33.0)
MCHC: 32.8 g/dL (ref 32.0–36.0)
MCV: 88 fL (ref 80.0–100.0)
MONO ABS: 490 {cells}/uL (ref 200–950)
MONOS PCT: 10 %
MPV: 9.7 fL (ref 7.5–12.5)
NEUTROS PCT: 46 %
Neutro Abs: 2254 cells/uL (ref 1500–7800)
Platelets: 408 10*3/uL — ABNORMAL HIGH (ref 140–400)
RBC: 4.65 MIL/uL (ref 4.20–5.80)
RDW: 13.6 % (ref 11.0–15.0)
WBC: 4.9 10*3/uL (ref 3.8–10.8)

## 2016-05-17 LAB — COMPREHENSIVE METABOLIC PANEL
ALK PHOS: 56 U/L (ref 40–115)
ALT: 15 U/L (ref 9–46)
AST: 24 U/L (ref 10–40)
Albumin: 4.4 g/dL (ref 3.6–5.1)
BILIRUBIN TOTAL: 0.5 mg/dL (ref 0.2–1.2)
BUN: 11 mg/dL (ref 7–25)
CO2: 28 mmol/L (ref 20–31)
CREATININE: 1.11 mg/dL (ref 0.60–1.35)
Calcium: 9.8 mg/dL (ref 8.6–10.3)
Chloride: 103 mmol/L (ref 98–110)
GLUCOSE: 97 mg/dL (ref 65–99)
POTASSIUM: 4.1 mmol/L (ref 3.5–5.3)
SODIUM: 140 mmol/L (ref 135–146)
TOTAL PROTEIN: 7.6 g/dL (ref 6.1–8.1)

## 2016-05-17 LAB — HEPATITIS C ANTIBODY: HCV Ab: NEGATIVE

## 2016-05-17 LAB — HEPATITIS B SURFACE ANTIGEN: Hepatitis B Surface Ag: NEGATIVE

## 2016-05-17 NOTE — Progress Notes (Signed)
Douglas Bridges is here to screen for the Study: A Phase 2b/3 Double Blind Safety and Efficacy Study of Injectable Cabotegravir compared to Daily Oral Tenofovir Disoproxil Fumarate/Emtricitabine (TDF/FTC), For Pre-Exposure Prophylaxis in HIV-Uninfected Cisgender Men and Transgender Women who have sex with Men.  Medication: Investigational Injectable Cabotegravir/placebo compared to Truvada/placebo. Duration: Around 4 years.  Informed consent was obtained after reviewing the whole consent with the participant. We answered all his questions and gave him information all alternative ways to get PREP.  He has a sex pro score of 3 and had [facility] about the [name]  He denies any history of seizures or bleeding issues. He has multiple tattoos, including one over his rt Buttock. He currently takes 2 medications for HBP, and has fiorcet for migraines which he says he has had for awhile. He is also supposed to start Vitamin D supplementation and [facility]. 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 He says he is getting over a cold now that he has had for the past week. We reviewed signs and symptoms of acute HIV infection. He will return next Wednesday for a complete PE by Dr. Daiva EvesVan Dam, an EKG and viral load blood draw.

## 2016-05-18 LAB — HIV ANTIBODY (ROUTINE TESTING W REFLEX): HIV: NONREACTIVE

## 2016-05-19 ENCOUNTER — Ambulatory Visit: Payer: Self-pay | Attending: Family Medicine | Admitting: Family Medicine

## 2016-05-19 ENCOUNTER — Encounter: Payer: Self-pay | Admitting: Family Medicine

## 2016-05-19 VITALS — BP 138/92 | HR 69 | Temp 97.4°F | Resp 18 | Ht 73.0 in | Wt 233.0 lb

## 2016-05-19 DIAGNOSIS — E559 Vitamin D deficiency, unspecified: Secondary | ICD-10-CM

## 2016-05-19 DIAGNOSIS — M79671 Pain in right foot: Secondary | ICD-10-CM | POA: Insufficient documentation

## 2016-05-19 DIAGNOSIS — R202 Paresthesia of skin: Secondary | ICD-10-CM

## 2016-05-19 DIAGNOSIS — R7303 Prediabetes: Secondary | ICD-10-CM

## 2016-05-19 DIAGNOSIS — Z7984 Long term (current) use of oral hypoglycemic drugs: Secondary | ICD-10-CM | POA: Insufficient documentation

## 2016-05-19 DIAGNOSIS — E1169 Type 2 diabetes mellitus with other specified complication: Secondary | ICD-10-CM

## 2016-05-19 DIAGNOSIS — M79609 Pain in unspecified limb: Secondary | ICD-10-CM

## 2016-05-19 DIAGNOSIS — E785 Hyperlipidemia, unspecified: Secondary | ICD-10-CM

## 2016-05-19 LAB — POCT GLYCOSYLATED HEMOGLOBIN (HGB A1C): Hemoglobin A1C: 6

## 2016-05-19 LAB — GLUCOSE, POCT (MANUAL RESULT ENTRY): POC GLUCOSE: 95 mg/dL (ref 70–99)

## 2016-05-19 MED ORDER — METFORMIN HCL 500 MG PO TABS: 500.0000 mg | ORAL_TABLET | Freq: Every day | ORAL | 2 refills | Status: DC

## 2016-05-19 MED ORDER — GLUCOSE BLOOD VI STRP: ORAL_STRIP | 12 refills | Status: DC

## 2016-05-19 MED ORDER — GABAPENTIN 300 MG PO CAPS: 300.0000 mg | ORAL_CAPSULE | Freq: Every day | ORAL | 0 refills | Status: DC

## 2016-05-19 MED ORDER — NAPROXEN 500 MG PO TABS: 500.0000 mg | ORAL_TABLET | Freq: Two times a day (BID) | ORAL | 0 refills | Status: DC

## 2016-05-19 MED ORDER — TRUEPLUS LANCETS 28G MISC: 1.0000 | Freq: Once | 12 refills | Status: AC

## 2016-05-19 MED ORDER — PRAVASTATIN SODIUM 10 MG PO TABS: 10.0000 mg | ORAL_TABLET | Freq: Every day | ORAL | 2 refills | Status: DC

## 2016-05-19 MED ORDER — VITAMIN D (ERGOCALCIFEROL) 1.25 MG (50000 UNIT) PO CAPS: 50000.0000 [IU] | ORAL_CAPSULE | ORAL | 0 refills | Status: AC

## 2016-05-19 MED ORDER — TRUE METRIX METER W/DEVICE KIT: 1.0000 | PACK | Freq: Once | 0 refills | Status: AC

## 2016-05-19 MED FILL — VIT D2 1.25 MG (50,000 UNIT: 1.25 MG | 84 days supply | Qty: 12 | Fill #0

## 2016-05-19 MED FILL — TRUE METRIX BLOOD GLUCOSE M: W/DEVICE | 365 days supply | Qty: 1 | Fill #0

## 2016-05-19 MED FILL — PRAVASTATIN NA 10 MG TAB: 10 | 30 days supply | Qty: 30 | Fill #0

## 2016-05-19 MED FILL — TRUE METRIX TEST STRIP: 25 days supply | Qty: 100 | Fill #0

## 2016-05-19 MED FILL — GABAPENTIN 300 MG CAPSULE: 300 | 20 days supply | Qty: 20 | Fill #0

## 2016-05-19 MED FILL — TRUEplus LANCETS 28G MISC: 100 days supply | Qty: 100 | Fill #0

## 2016-05-19 MED FILL — ?METFORMIN HCL 500MG TABLET: 500 | 30 days supply | Qty: 30 | Fill #0

## 2016-05-19 NOTE — Progress Notes (Signed)
JAPatient is here for right throbbing pain  Patient stated that its been a month like that   Patient is only taking BP med as current   Patient has not taking his bP medication for today  Patient has not eaten for today

## 2016-05-19 NOTE — Patient Instructions (Addendum)
Start checking your blood sugars twice a day , am and pm. Keep log of blood sugars and glucometer to bring to next office visit.    Blood Glucose Monitoring, Adult Monitoring your blood sugar (glucose) helps you manage your diabetes. It also helps you and your health care provider determine how well your diabetes management plan is working. Blood glucose monitoring involves checking your blood glucose as often as directed, and keeping a record (log) of your results over time. Why should I monitor my blood glucose? Checking your blood glucose regularly can:  Help you understand how food, exercise, illnesses, and medicines affect your blood glucose.  Let you know what your blood glucose is at any time. You can quickly tell if you are having low blood glucose (hypoglycemia) or high blood glucose (hyperglycemia).  Help you and your health care provider adjust your medicines as needed. When should I check my blood glucose? Follow instructions from your health care provider about how often to check your blood glucose. This may depend on:  The type of diabetes you have.  How well-controlled your diabetes is.  Medicines you are taking. If you have type 1 diabetes:   Check your blood glucose at least 2 times a day.  Also check your blood glucose:  Before every insulin injection.  Before and after exercise.  Between meals.  2 hours after a meal.  Occasionally between 2:00 a.m. and 3:00 a.m., as directed.  Before potentially dangerous tasks, like driving or using heavy machinery.  At bedtime.  You may need to check your blood glucose more often, up to 6-10 times a day:  If you use an insulin pump.  If you need multiple daily injections (MDI).  If your diabetes is not well-controlled.  If you are ill.  If you have a history of severe hypoglycemia.  If you have a history of not knowing when your blood glucose is getting low (hypoglycemia unawareness). If you have type 2  diabetes:   If you take insulin or other diabetes medicines, check your blood glucose at least 2 times a day.  If you are on intensive insulin therapy, check your blood glucose at least 4 times a day. Occasionally, you may also need to check between 2:00 a.m. and 3:00 a.m., as directed.  Also check your blood glucose:  Before and after exercise.  Before potentially dangerous tasks, like driving or using heavy machinery.  You may need to check your blood glucose more often if:  Your medicine is being adjusted.  Your diabetes is not well-controlled.  You are ill. What is a blood glucose log?  A blood glucose log is a record of your blood glucose readings. It helps you and your health care provider:  Look for patterns in your blood glucose over time.  Adjust your diabetes management plan as needed.  Every time you check your blood glucose, write down your result and notes about things that may be affecting your blood glucose, such as your diet and exercise for the day.  Most glucose meters store a record of glucose readings in the meter. Some meters allow you to download your records to a computer. How do I check my blood glucose? Follow these steps to get accurate readings of your blood glucose: Supplies needed    Blood glucose meter.  Test strips for your meter. Each meter has its own strips. You must use the strips that come with your meter.  A needle to prick your finger (lancet).  Do not use lancets more than once.  A device that holds the lancet (lancing device).  A journal or log book to write down your results. Procedure   Wash your hands with soap and water.  Prick the side of your finger (not the tip) with the lancet. Use a different finger each time.  Gently rub the finger until a small drop of blood appears.  Follow instructions that come with your meter for inserting the test strip, applying blood to the strip, and using your blood glucose meter.  Write  down your result and any notes. Alternative testing sites   Some meters allow you to use areas of your body other than your finger (alternative sites) to test your blood.  If you think you may have hypoglycemia, or if you have hypoglycemia unawareness, do not use alternative sites. Use your finger instead.  Alternative sites may not be as accurate as the fingers, because blood flow is slower in these areas. This means that the result you get may be delayed, and it may be different from the result that you would get from your finger.  The most common alternative sites are:  Forearm.  Thigh.  Palm of the hand. Additional tips   Always keep your supplies with you.  If you have questions or need help, all blood glucose meters have a 24-hour "hotline" number that you can call. You may also contact your health care provider.  After you use a few boxes of test strips, adjust (calibrate) your blood glucose meter by following instructions that came with your meter. This information is not intended to replace advice given to you by your health care provider. Make sure you discuss any questions you have with your health care provider. Document Released: 12/30/2002 Document Revised: 07/17/2015 Document Reviewed: 06/08/2015 Elsevier Interactive Patient Education  2017 [employer]   Metformin tablets What is this medicine? METFORMIN (met FOR min) is used to treat type 2 diabetes. It helps to control blood sugar. Treatment is combined with diet and exercise. This medicine can be used alone or with other medicines for diabetes. This medicine may be used for other purposes; ask your health care provider or pharmacist if you have questions. COMMON BRAND NAME(S): Glucophage What should I tell my health care provider before I take this medicine? They need to know if you have any of these conditions: -anemia -dehydration -heart disease -frequently drink alcohol-containing beverages -kidney  disease -liver disease -polycystic ovary syndrome -serious infection or injury -vomiting -an unusual or allergic reaction to metformin, other medicines, foods, dyes, or preservatives -pregnant or trying to get pregnant -breast-feeding How should I use this medicine? Take this medicine by mouth with a glass of water. Follow the directions on the prescription label. Take this medicine with food. Take your medicine at regular intervals. Do not take your medicine more often than directed. Do not stop taking except on your doctor's advice. Talk to your pediatrician regarding the use of this medicine in children. While this drug may be prescribed for children as young as 5 years of age for selected conditions, precautions do apply. Overdosage: If you think you have taken too much of this medicine contact a poison control center or emergency room at once. NOTE: This medicine is only for you. Do not share this medicine with others. What if I miss a dose? If you miss a dose, take it as soon as you can. If it is almost time for your next dose, take only  that dose. Do not take double or extra doses. What may interact with this medicine? Do not take this medicine with any of the following medications: -dofetilide -certain contrast medicines given before X-rays, CT scans, MRI, or other procedures This medicine may also interact with the following medications: -acetazolamide -certain antiviral medicines for HIV or AIDS or for hepatitis, like adefovir, dolutegravir, emtricitabine, entecavir, lamivudine, paritaprevir, or tenofovir -cimetidine -cobicistat -crizotinib -dichlorphenamide -digoxin -diuretics -male hormones, like estrogens or progestins and birth control pills -glycopyrrolate -isoniazid -lamotrigine -medicines for blood pressure, heart disease, irregular heart beat -memantine -midodrine -methazolamide -morphine -niacin -phenothiazines like chlorpromazine, mesoridazine,  prochlorperazine, thioridazine -phenytoin -procainamide -propantheline -quinidine -quinine -ranitidine -ranolazine -steroid medicines like prednisone or cortisone -stimulant medicines for attention disorders, weight loss, or to stay awake -thyroid medicines -topiramate -trimethoprim -trospium -vancomycin -vandetanib -zonisamide This list may not describe all possible interactions. Give your health care provider a list of all the medicines, herbs, non-prescription drugs, or dietary supplements you use. Also tell them if you smoke, drink alcohol, or use illegal drugs. Some items may interact with your medicine. What should I watch for while using this medicine? Visit your doctor or health care professional for regular checks on your progress. A test called the HbA1C (A1C) will be monitored. This is a simple blood test. It measures your blood sugar control over the last 2 to 3 months. You will receive this test every 3 to 6 months. Learn how to check your blood sugar. Learn the symptoms of low and high blood sugar and how to manage them. Always carry a quick-source of sugar with you in case you have symptoms of low blood sugar. Examples include hard sugar candy or glucose tablets. Make sure others know that you can choke if you eat or drink when you develop serious symptoms of low blood sugar, such as seizures or unconsciousness. They must get medical help at once. Tell your doctor or health care professional if you have high blood sugar. You might need to change the dose of your medicine. If you are sick or exercising more than usual, you might need to change the dose of your medicine. Do not skip meals. Ask your doctor or health care professional if you should avoid alcohol. Many nonprescription cough and cold products contain sugar or alcohol. These can affect blood sugar. This medicine may cause ovulation in premenopausal women who do not have regular monthly periods. This may increase your  chances of becoming pregnant. You should not take this medicine if you become pregnant or think you may be pregnant. Talk with your doctor or health care professional about your birth control options while taking this medicine. Contact your doctor or health care professional right away if think you are pregnant. If you are going to need surgery, a MRI, CT scan, or other procedure, tell your doctor that you are taking this medicine. You may need to stop taking this medicine before the procedure. Wear a medical ID bracelet or chain, and carry a card that describes your disease and details of your medicine and dosage times. What side effects may I notice from receiving this medicine? Side effects that you should report to your doctor or health care professional as soon as possible: -allergic reactions like skin rash, itching or hives, swelling of the face, lips, or tongue -breathing problems -feeling faint or lightheaded, falls -muscle aches or pains -signs and symptoms of low blood sugar such as feeling anxious, confusion, dizziness, increased hunger, unusually weak or tired, sweating,  shakiness, cold, irritable, headache, blurred vision, fast heartbeat, loss of consciousness -slow or irregular heartbeat -unusual stomach pain or discomfort -unusually tired or weak Side effects that usually do not require medical attention (report to your doctor or health care professional if they continue or are bothersome): -diarrhea -headache -heartburn -metallic taste in mouth -nausea -stomach gas, upset This list may not describe all possible side effects. Call your doctor for medical advice about side effects. You may report side effects to FDA at 1-800-FDA-1088. Where should I keep my medicine? Keep out of the reach of children. Store at room temperature between 15 and 30 degrees C (59 and 86 degrees F). Protect from moisture and light. Throw away any unused medicine after the expiration date. NOTE: This  sheet is a summary. It may not cover all possible information. If you have questions about this medicine, talk to your doctor, pharmacist, or health care provider.  2018 Elsevier/Gold Standard (2015-07-08 15:34:19)   Prediabetes Prediabetes is the condition of having a blood sugar (blood glucose) level that is higher than it should be, but not high enough for you to be diagnosed with type 2 diabetes. Having prediabetes puts you at risk for developing type 2 diabetes (type 2 diabetes mellitus). Prediabetes may be called impaired glucose tolerance or impaired fasting glucose. Prediabetes usually does not cause symptoms. Your health care provider can diagnose this condition with blood tests. You may be tested for prediabetes if you are overweight and if you have at least one other risk factor for prediabetes. Risk factors for prediabetes include:  Having a family member with type 2 diabetes.  Being overweight or obese.  Being older than age 69.  Being of American-Indian, African-American, Hispanic/Latino, or Asian/Pacific Islander descent.  Having an inactive (sedentary) lifestyle.  Having a history of gestational diabetes or polycystic ovarian syndrome (PCOS).  Having low levels of good cholesterol (HDL-C) or high levels of blood fats (triglycerides).  Having high blood pressure. What is blood glucose and how is blood glucose measured?   Blood glucose refers to the amount of glucose in your bloodstream. Glucose comes from eating foods that contain sugars and starches (carbohydrates) that the body breaks down into glucose. Your blood glucose level may be measured in mg/dL (milligrams per deciliter) or mmol/L (millimoles per liter).Your blood glucose may be checked with one or more of the following blood tests:  A fasting blood glucose (FBG) test. You will not be allowed to eat (you will fast) for at least 8 hours before a blood sample is taken.  A normal range for FBG is 70-100 mg/dl  (3.9-5.6 mmol/L).  An A1c (hemoglobin A1c) blood test. This test provides information about blood glucose control over the previous 2?57month.  An oral glucose tolerance test (OGTT). This test measures your blood glucose twice:  After fasting. This is your baseline level.  Two hours after you drink a beverage that contains glucose. You may be diagnosed with prediabetes:  If your FBG is 100?125 mg/dL (5.6-6.9 mmol/L).  If your A1c level is 5.7?6.4%.  If your OGGT result is 140?199 mg/dL (7.8-11 mmol/L). These blood tests may be repeated to confirm your diagnosis. What happens if blood glucose is too high? The pancreas produces a hormone (insulin) that helps move glucose from the bloodstream into cells. When cells in the body do not respond properly to insulin that the body makes (insulin resistance), excess glucose builds up in the blood instead of going into cells. As a result, high blood glucose (  hyperglycemia) can develop, which can cause many complications. This is a symptom of prediabetes. What can happen if blood glucose stays higher than normal for a long time? Having high blood glucose for a long time is dangerous. Too much glucose in your blood can damage your nerves and blood vessels. Long-term damage can lead to complications from diabetes, which may include:  Heart disease.  Stroke.  Blindness.  Kidney disease.  Depression.  Poor circulation in the feet and legs, which could lead to surgical removal (amputation) in severe cases. How can prediabetes be prevented from turning into type 2 diabetes?   To help prevent type 2 diabetes, take the following actions:  Be physically active.  Do moderate-intensity physical activity for at least 30 minutes on at least 5 days of the week, or as much as told by your health care provider. This could be brisk walking, biking, or water aerobics.  Ask your health care provider what activities are safe for you. A mix of physical  activities may be best, such as walking, swimming, cycling, and strength training.  Lose weight as told by your health care provider.  Losing 5-7% of your body weight can reverse insulin resistance.  Your health care provider can determine how much weight loss is best for you and can help you lose weight safely.  Follow a healthy meal plan. This includes eating lean proteins, complex carbohydrates, fresh fruits and vegetables, low-fat dairy products, and healthy fats.  Follow instructions from your health care provider about eating or drinking restrictions.  Make an appointment to see a diet and nutrition specialist (registered dietitian) to help you create a healthy eating plan that is right for you.  Do not smoke or use any tobacco products, such as cigarettes, chewing tobacco, and e-cigarettes. If you need help quitting, ask your health care provider.  Take over-the-counter and prescription medicines as told by your health care provider. You may be prescribed medicines that help lower the risk of type 2 diabetes. This information is not intended to replace advice given to you by your health care provider. Make sure you discuss any questions you have with your health care provider. Document Released: 04/20/2015 Document Revised: 06/04/2015 Document Reviewed: 02/17/2015 Elsevier Interactive Patient Education  2017 [employer] Inc.  Pravastatin tablets What is this medicine? PRAVASTATIN (PRA va stat in) is known as a HMG-CoA reductase inhibitor or 'statin'. It lowers the level of cholesterol and triglycerides in the blood. This drug may also reduce the risk of heart attack, stroke, or other health problems in patients with risk factors for heart disease. Diet and lifestyle changes are often used with this drug. This medicine may be used for other purposes; ask your health care provider or pharmacist if you have questions. COMMON BRAND NAME(S): Pravachol What should I tell my health care  provider before I take this medicine? They need to know if you have any of these conditions: -frequently drink alcoholic beverages -kidney disease -liver disease -muscle aches or weakness -other medical condition -an unusual or allergic reaction to pravastatin, other medicines, foods, dyes, or preservatives -pregnant or trying to get pregnant -breast-feeding How should I use this medicine? Take pravastatin tablets by mouth. Swallow the tablets with a drink of water. Pravastatin can be taken at anytime of the day, with or without food. Follow the directions on the prescription label. Take your doses at regular intervals. Do not take your medicine more often than directed. Talk to your pediatrician regarding the use of this  medicine in children. Special care may be needed. Pravastatin has been used in children as young as 46 years of age. Overdosage: If you think you have taken too much of this medicine contact a poison control center or emergency room at once. NOTE: This medicine is only for you. Do not share this medicine with others. What if I miss a dose? If you miss a dose, take it as soon as you can. If it is almost time for your next dose, take only that dose. Do not take double or extra doses. What may interact with this medicine? This medicine may interact with the following medications: -colchicine -cyclosporine -other medicines for high cholesterol -some antibiotics like azithromycin, clarithromycin, erythromycin, and telithromycin This list may not describe all possible interactions. Give your health care provider a list of all the medicines, herbs, non-prescription drugs, or dietary supplements you use. Also tell them if you smoke, drink alcohol, or use illegal drugs. Some items may interact with your medicine. What should I watch for while using this medicine? Visit your doctor or health care professional for regular check-ups. You may need regular tests to make sure your liver is  working properly. Tell your doctor or health care professional right away if you get any unexplained muscle pain, tenderness, or weakness, especially if you also have a fever and tiredness. Your doctor or health care professional may tell you to stop taking this medicine if you develop muscle problems. If your muscle problems do not go away after stopping this medicine, contact your health care professional. This drug is only part of a total heart-health program. Your doctor or a dietician can suggest a low-cholesterol and low-fat diet to help. Avoid alcohol and smoking, and keep a proper exercise schedule. Do not use this drug if you are pregnant or breast-feeding. Serious side effects to an unborn child or to an infant are possible. Talk to your doctor or pharmacist for more information. This medicine may affect blood sugar levels. If you have diabetes, check with your doctor or health care professional before you change your diet or the dose of your diabetic medicine. If you are going to have surgery tell your health care professional that you are taking this drug. What side effects may I notice from receiving this medicine? Side effects that you should report to your doctor or health care professional as soon as possible: -allergic reactions like skin rash, itching or hives, swelling of the face, lips, or tongue -dark urine -fever -muscle pain, cramps, or weakness -redness, blistering, peeling or loosening of the skin, including inside the mouth -trouble passing urine or change in the amount of urine -unusually weak or tired -yellowing of the eyes or skin Side effects that usually do not require medical attention (report to your doctor or health care professional if they continue or are bothersome): -gas -headache -heartburn -indigestion -stomach pain This list may not describe all possible side effects. Call your doctor for medical advice about side effects. You may report side effects to  FDA at 1-800-FDA-1088. Where should I keep my medicine? Keep out of the reach of children. Store at room temperature between 15 to 30 degrees C (59 to 86 degrees F). Protect from light. Keep container tightly closed. Throw away any unused medicine after the expiration date. NOTE: This sheet is a summary. It may not cover all possible information. If you have questions about this medicine, talk to your doctor, pharmacist, or health care provider.  2018 Elsevier/Gold Standard (2014-07-24  16:00:27)

## 2016-05-19 NOTE — Progress Notes (Signed)
Subjective:  Patient ID: Douglas Bridges, male    DOB: 03/19/1990  Age: 26 y.o. MRN: [id number]  CC: Establish Care   HPI Douglas Bridges presents for   Right foot pain: 1 month history. Denies any injury, swelling, wounds, daily alcohol use, temperature change, or recent history of prolonged travel. Associated symptoms include paresthesias. Symptoms aggravated with standing and prolonged sitting. Denies taking anything for symptoms. History of pre-diabetes. Last hgba1c was indicated pre-diabetes. He denies adhering to a lower carbohydrate and lower fat diet, and exercise.   Vitamin D deficiency: History of vitamin d deficiency. He reports not picking up his vitamin D supplement and requests prescription be re-sent.    Outpatient Medications Prior to Visit  Medication Sig Dispense Refill  . hydrochlorothiazide (HYDRODIURIL) 25 MG tablet Take 1 tablet (25 mg total) by mouth daily. 30 tablet 1  . amLODipine (NORVASC) 5 MG tablet Take 1 tablet (5 mg total) by mouth daily. 90 tablet 3  . amoxicillin (AMOXIL) 500 MG capsule Take 1 capsule (500 mg total) by mouth 2 (two) times daily. 20 capsule 0  . butalbital-acetaminophen-caffeine (FIORICET, ESGIC) 50-325-40 MG tablet Take 1 tablet by mouth every 6 (six) hours as needed for migraine. 40 tablet 0  . carbamide peroxide (DEBROX) 6.5 % otic solution Place 10 drops into both ears 2 (two) times daily. For 4 days. 15 mL 0  . fluticasone (FLONASE) 50 MCG/ACT nasal spray Place 2 sprays into both nostrils daily. (Patient not taking: Reported on 05/13/2016) 16 g 0  . HYDROcodone-acetaminophen (NORCO/VICODIN) 5-325 MG tablet Take 1 tablet by mouth every 6 (six) hours as needed for moderate pain. (Patient not taking: Reported on 02/11/2016) 20 tablet 0  . ibuprofen (ADVIL,MOTRIN) 200 MG tablet Take 400 mg by mouth every 6 (six) hours as needed for moderate pain.    Marland Kitchen omeprazole (PRILOSEC) 20 MG capsule Take 1 capsule (20 mg total) by mouth daily. 30 capsule 1   . Vitamin D, Ergocalciferol, (DRISDOL) 50000 units CAPS capsule Take 1 capsule (50,000 Units total) by mouth every 7 (seven) days. 12 capsule 0   No facility-administered medications prior to visit.     ROS Review of Systems  Constitutional: Negative.   Respiratory: Negative.   Cardiovascular: Negative.   Gastrointestinal: Negative.   Musculoskeletal: Positive for myalgias.  Neurological:       Parathesias    Objective:  BP (!) 138/92 (BP Location: Left Arm, Patient Position: Sitting, Cuff Size: Normal)   Pulse 69   Temp 97.4 F (36.3 C) (Oral)   Resp 18   Ht 6' 1"  (1.854 m)   Wt 233 lb (105.7 kg)   SpO2 99%   BMI 30.74 kg/m   BP/Weight 05/19/2016 08/11/9560 01/13/863  Systolic BP 784 696 295  Diastolic BP 92 89 90  Wt. (Lbs) 233 230.75 -  BMI 30.74 30.24 -     Physical Exam  Eyes: Conjunctivae are normal. Pupils are equal, round, and reactive to light.  Cardiovascular: Normal rate, regular rhythm, normal heart sounds and intact distal pulses.   Pulmonary/Chest: Effort normal and breath sounds normal.  Abdominal: Soft. Bowel sounds are normal.  Musculoskeletal:  Right foot pain with plantar and dorsiflexion.   Skin: Skin is warm and dry.  Nursing note and vitals reviewed.   Diabetic Foot Exam - Simple   Simple Foot Form Diabetic Foot exam was performed with the following findings:  Yes 05/19/2016 11:12 AM  Visual Inspection No deformities, no ulcerations, no other  skin breakdown bilaterally:  Yes Sensation Testing Intact to touch and monofilament testing bilaterally:  Yes Pulse Check Posterior Tibialis and Dorsalis pulse intact bilaterally:  Yes Comments     Assessment & Plan:   Problem List Items Addressed This Visit    None    Visit Diagnoses    Pre-diabetes    -  Primary   Start taking blood glucose BID. Keep blood glucose log and bring glucometer to next office visit.    Relevant Medications   glucose blood test strip   metFORMIN (GLUCOPHAGE) 500  MG tablet   Other Relevant Orders   POCT glycosylated hemoglobin (Hb A1C) (Completed)   Glucose (CBG) (Completed)   Paresthesia and pain of right extremity       Relevant Medications   naproxen (NAPROSYN) 500 MG tablet   gabapentin (NEURONTIN) 300 MG capsule   Other Relevant Orders   DG Foot Complete Right   Vitamin D deficiency       Relevant Medications   Vitamin D, Ergocalciferol, (DRISDOL) 50000 units CAPS capsule   Hyperlipidemia associated with type 2 diabetes mellitus (HCC)       Relevant Medications   pravastatin (PRAVACHOL) 10 MG tablet      Meds ordered this encounter  Medications  . Vitamin D, Ergocalciferol, (DRISDOL) 50000 units CAPS capsule    Sig: Take 1 capsule (50,000 Units total) by mouth every 7 (seven) days.    Dispense:  12 capsule    Refill:  0    Order Specific Question:   Supervising Provider    Answer:   Tresa Garter [id number]  . naproxen (NAPROSYN) 500 MG tablet    Sig: Take 1 tablet (500 mg total) by mouth 2 (two) times daily with a meal. For 6 days. Then take 1 tablet as needed daily with meal for 8 days.    Dispense:  20 tablet    Refill:  0    Order Specific Question:   Supervising Provider    Answer:   Tresa Garter [id number]  . gabapentin (NEURONTIN) 300 MG capsule    Sig: Take 1 capsule (300 mg total) by mouth at bedtime.    Dispense:  20 capsule    Refill:  0    Order Specific Question:   Supervising Provider    Answer:   Tresa Garter [id number]  . TRUEPLUS LANCETS 28G MISC    Sig: 1 kit by Does not apply route once.    Dispense:  100 each    Refill:  12    Order Specific Question:   Supervising Provider    Answer:   Tresa Garter [id number]  . Blood Glucose Monitoring Suppl (TRUE METRIX METER) w/Device KIT    Sig: 1 Device by Does not apply route once.    Dispense:  1 kit    Refill:  0    Order Specific Question:   Supervising Provider    Answer:   Tresa Garter [id number]  . glucose blood test  strip    Sig: Use as instructed    Dispense:  100 each    Refill:  12    Order Specific Question:   Supervising Provider    Answer:   Tresa Garter [[id number]]  . metFORMIN (GLUCOPHAGE) 500 MG tablet    Sig: Take 1 tablet (500 mg total) by mouth daily with breakfast.    Dispense:  30 tablet    Refill:  2    Order Specific Question:  Supervising Provider    Answer:   Tresa Garter [id number]  . pravastatin (PRAVACHOL) 10 MG tablet    Sig: Take 1 tablet (10 mg total) by mouth daily.    Dispense:  30 tablet    Refill:  2    Order Specific Question:   Supervising Provider    Answer:   Tresa Garter [id number]    Follow-up: Return in about 2 weeks (around 06/02/2016) for DM.   Alfonse Spruce FNP

## 2016-05-20 MED FILL — NAPROXEN 500 MG TABLET: 500 | 14 days supply | Qty: 20 | Fill #0

## 2016-05-25 ENCOUNTER — Encounter (INDEPENDENT_AMBULATORY_CARE_PROVIDER_SITE_OTHER): Payer: Self-pay | Admitting: Infectious Disease

## 2016-05-25 VITALS — BP 143/92 | HR 86 | Temp 98.7°F | Wt 234.5 lb

## 2016-05-25 DIAGNOSIS — Z006 Encounter for examination for normal comparison and control in clinical research program: Secondary | ICD-10-CM
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
## 2016-05-27 LAB — HIV-1 RNA QUANT-NO REFLEX-BLD
HIV 1 RNA Quant: <20 copies/mL
HIV-1 RNA QUANT, LOG: NOT DETECTED {Log_copies}/mL

## 2016-06-01 ENCOUNTER — Encounter (INDEPENDENT_AMBULATORY_CARE_PROVIDER_SITE_OTHER): Payer: Self-pay | Admitting: *Deleted

## 2016-06-01 VITALS — BP 121/85 | HR 74 | Temp 98.2°F | Resp 16 | Wt 232.5 lb

## 2016-06-01 DIAGNOSIS — Z006 Encounter for examination for normal comparison and control in clinical research program: Secondary | ICD-10-CM

## 2016-06-01 LAB — COMPREHENSIVE METABOLIC PANEL
ALBUMIN: 4.6 g/dL (ref 3.6–5.1)
ALT: 16 U/L (ref 9–46)
AST: 19 U/L (ref 10–40)
Alkaline Phosphatase: 61 U/L (ref 40–115)
BILIRUBIN TOTAL: 0.6 mg/dL (ref 0.2–1.2)
BUN: 9 mg/dL (ref 7–25)
CO2: 28 mmol/L (ref 20–31)
CREATININE: 1.31 mg/dL (ref 0.60–1.35)
Calcium: 10 mg/dL (ref 8.6–10.3)
Chloride: 101 mmol/L (ref 98–110)
Glucose, Bld: 94 mg/dL (ref 65–99)
Potassium: 3.8 mmol/L (ref 3.5–5.3)
SODIUM: 137 mmol/L (ref 135–146)
Total Protein: 7.7 g/dL (ref 6.1–8.1)

## 2016-06-01 LAB — CBC WITH DIFFERENTIAL/PLATELET
Basophils Absolute: 69 cells/uL (ref 0–200)
Basophils Relative: 1 %
EOS PCT: 3 %
Eosinophils Absolute: 207 cells/uL (ref 15–500)
HCT: 42.1 % (ref 38.5–50.0)
HEMOGLOBIN: 13.7 g/dL (ref 13.2–17.1)
LYMPHS ABS: 2484 {cells}/uL (ref 850–3900)
Lymphocytes Relative: 36 %
MCH: 28.5 pg (ref 27.0–33.0)
MCHC: 32.5 g/dL (ref 32.0–36.0)
MCV: 87.5 fL (ref 80.0–100.0)
MPV: 9.6 fL (ref 7.5–12.5)
Monocytes Absolute: 552 cells/uL (ref 200–950)
Monocytes Relative: 8 %
NEUTROS ABS: 3588 {cells}/uL (ref 1500–7800)
NEUTROS PCT: 52 %
PLATELETS: 398 10*3/uL (ref 140–400)
RBC: 4.81 MIL/uL (ref 4.20–5.80)
RDW: 13.5 % (ref 11.0–15.0)
WBC: 6.9 10*3/uL (ref 3.8–10.8)

## 2016-06-01 LAB — LIPID PANEL
CHOLESTEROL: 199 mg/dL (ref <200)
HDL: 31 mg/dL — ABNORMAL LOW (ref >40)
LDL CALC: 121 mg/dL — AB (ref <100)
Total CHOL/HDL Ratio: 6.4 Ratio — ABNORMAL HIGH (ref <5.0)
Triglycerides: 237 mg/dL — ABNORMAL HIGH (ref <150)
VLDL: 47 mg/dL — AB (ref <30)

## 2016-06-01 LAB — HIV ANTIBODY (ROUTINE TESTING W REFLEX): HIV: NONREACTIVE

## 2016-06-01 LAB — CK: CK TOTAL: 334 U/L — AB (ref 44–196)

## 2016-06-01 LAB — AMYLASE: Amylase: 35 U/L (ref 21–101)

## 2016-06-01 LAB — LIPASE: Lipase: 11 U/L (ref 7–60)

## 2016-06-01 LAB — PHOSPHORUS: PHOSPHORUS: 3.7 mg/dL (ref 2.5–4.5)

## 2016-06-01 MED ORDER — EMTRICITABINE-TENOFOVIR DF 200-300 MG PO TABS: ORAL_TABLET | ORAL | 11 refills | Status: DC

## 2016-06-01 MED ORDER — STUDY - INVESTIGATIONAL MEDICATION: 99 refills | Status: DC

## 2016-06-01 NOTE — Progress Notes (Signed)
After verifying for eligiblity and verification of a negative HIV rapid test, Eian was randomized to Study: A Phase 2b/3 Double Blind Safety and Efficacy Study of Injectable Cabotegravir compared to Daily Oral Tenofovir Disoproxil Fumarate/Emtricitabine (TDF/FTC), For Pre-Exposure Prophylaxis in HIV-Uninfected Cisgender Men and Transgender Women who have sex with Men.  Medication: Investigational Injectable Cabotegravir/placebo compared to Truvada/placebo. Duration: Around 4 years.  He denied any new complaints, but did say that the health clinic he goes to had started him on metformin for prediabetes. He said he was told that hopefully it would only be for a short while. He is checking his blood sugars daily. He has chronic pain in both feet that comes and goes, for which he takes ibuprofen. He also has chronic migraines. He was recently started on 2 BP meds in March, so he is planning on taking his new study meds with them every day. We went over dosing instructions, adherence and when to call for side effects of the medications. He has my contact information if he has questions or concerns about the study. He has a tattoo over his rt buttock and Dr. Daiva EvesVan Dam did not think it would interfere with evaluation of an ISR if it occurred in the future. He will be returning in 2 weeks for followup.

## 2016-06-02 LAB — URINALYSIS, MICROSCOPIC ONLY
Bacteria, UA: NONE SEEN [HPF]
Casts: NONE SEEN [LPF]
Crystals: NONE SEEN [HPF]
Squamous Epithelial / LPF: NONE SEEN [HPF] (ref <=5)
WBC UA: NONE SEEN WBC/HPF (ref <=5)
Yeast: NONE SEEN [HPF]

## 2016-06-02 LAB — HEPATITIS B SURFACE ANTIBODY,QUALITATIVE: HEP B S AB: POSITIVE — AB

## 2016-06-02 LAB — HEPATITIS B CORE ANTIBODY, TOTAL: Hep B Core Total Ab: NONREACTIVE

## 2016-06-02 LAB — GC/CHLAMYDIA PROBE AMP
CT Probe RNA: NOT DETECTED
GC Probe RNA: NOT DETECTED

## 2016-06-02 LAB — URINALYSIS, ROUTINE W REFLEX MICROSCOPIC
BILIRUBIN URINE: NEGATIVE
GLUCOSE, UA: NEGATIVE
HGB URINE DIPSTICK: NEGATIVE
Ketones, ur: NEGATIVE
LEUKOCYTES UA: NEGATIVE
Nitrite: NEGATIVE
PH: 6 (ref 5.0–8.0)
Specific Gravity, Urine: 1.027 (ref 1.001–1.035)

## 2016-06-02 LAB — RPR

## 2016-06-05 LAB — CT/NG RNA, TMA RECTAL
Chlamydia Trachomatis RNA: NOT DETECTED
Neisseria Gonorrhoeae RNA: NOT DETECTED

## 2016-06-08 ENCOUNTER — Ambulatory Visit: Payer: Self-pay | Admitting: Family Medicine

## 2016-06-14 ENCOUNTER — Telehealth: Payer: Self-pay

## 2016-06-14 ENCOUNTER — Encounter: Payer: Self-pay | Admitting: *Deleted

## 2016-06-14 NOTE — Telephone Encounter (Signed)
Called because ppt missed 9:00 appt. Ppt stated they overslept.  Research can see ppt at 2:00 today; ppt is able to meet that time.

## 2016-06-15 ENCOUNTER — Encounter: Payer: Self-pay | Admitting: *Deleted

## 2016-06-15 VITALS — BP 128/91 | HR 71 | Temp 98.1°F | Wt 234.2 lb

## 2016-06-15 DIAGNOSIS — Z006 Encounter for examination for normal comparison and control in clinical research program: Secondary | ICD-10-CM

## 2016-06-15 LAB — CBC WITH DIFFERENTIAL/PLATELET
BASOS PCT: 1 %
Basophils Absolute: 60 cells/uL (ref 0–200)
EOS ABS: 120 {cells}/uL (ref 15–500)
Eosinophils Relative: 2 %
HEMATOCRIT: 39.6 % (ref 38.5–50.0)
HEMOGLOBIN: 13.1 g/dL — AB (ref 13.2–17.1)
LYMPHS ABS: 2400 {cells}/uL (ref 850–3900)
LYMPHS PCT: 40 %
MCH: 29.4 pg (ref 27.0–33.0)
MCHC: 33.1 g/dL (ref 32.0–36.0)
MCV: 88.8 fL (ref 80.0–100.0)
MONO ABS: 540 {cells}/uL (ref 200–950)
MPV: 9.3 fL (ref 7.5–12.5)
Monocytes Relative: 9 %
NEUTROS PCT: 48 %
Neutro Abs: 2880 cells/uL (ref 1500–7800)
Platelets: 396 10*3/uL (ref 140–400)
RBC: 4.46 MIL/uL (ref 4.20–5.80)
RDW: 13.9 % (ref 11.0–15.0)
WBC: 6 10*3/uL (ref 3.8–10.8)

## 2016-06-15 NOTE — Progress Notes (Signed)
S/w ppt in Douglas E.'s office; rcvd consent to send mail (confirmed mailing address). Ppt would like to rcv appt reminders via text. Informed ppt about mychart and showed them the app as well. Sent ppt mychart request via text through Epic.  Ppt brought a friend who is interested in the study. Douglas provided them with a consent form to review.

## 2016-06-15 NOTE — Progress Notes (Signed)
Douglas Bridges came in today for his week 2 visit for HPTN. He denies any side effects from the medications and his adherence has been 100%. He has a friend with him who is also interested in the study. He will return on 6/19 for the week 4 visit.

## 2016-06-16 LAB — COMPREHENSIVE METABOLIC PANEL
ALBUMIN: 4.3 g/dL (ref 3.6–5.1)
ALT: 17 U/L (ref 9–46)
AST: 21 U/L (ref 10–40)
Alkaline Phosphatase: 59 U/L (ref 40–115)
BILIRUBIN TOTAL: 0.4 mg/dL (ref 0.2–1.2)
BUN: 8 mg/dL (ref 7–25)
CALCIUM: 9.7 mg/dL (ref 8.6–10.3)
CO2: 26 mmol/L (ref 20–31)
Chloride: 103 mmol/L (ref 98–110)
Creat: 1.23 mg/dL (ref 0.60–1.35)
GLUCOSE: 79 mg/dL (ref 65–99)
POTASSIUM: 4.2 mmol/L (ref 3.5–5.3)
Sodium: 139 mmol/L (ref 135–146)
Total Protein: 7.5 g/dL (ref 6.1–8.1)

## 2016-06-16 LAB — PHOSPHORUS: Phosphorus: 2.7 mg/dL (ref 2.5–4.5)

## 2016-06-16 LAB — HIV ANTIBODY (ROUTINE TESTING W REFLEX): HIV 1&2 Ab, 4th Generation: NONREACTIVE

## 2016-06-16 LAB — LIPASE: LIPASE: 11 U/L (ref 7–60)

## 2016-06-16 LAB — AMYLASE: Amylase: 34 U/L (ref 21–101)

## 2016-06-16 LAB — CK: CK TOTAL: 407 U/L — AB (ref 44–196)

## 2016-06-17 ENCOUNTER — Ambulatory Visit: Payer: Self-pay | Admitting: Family Medicine

## 2016-06-22 ENCOUNTER — Ambulatory Visit: Payer: Self-pay | Attending: Family Medicine | Admitting: Family Medicine

## 2016-06-22 ENCOUNTER — Encounter: Payer: Self-pay | Admitting: Family Medicine

## 2016-06-22 VITALS — BP 142/84 | HR 68 | Temp 98.3°F | Resp 18 | Ht 73.0 in | Wt 233.2 lb

## 2016-06-22 DIAGNOSIS — I10 Essential (primary) hypertension: Secondary | ICD-10-CM

## 2016-06-22 DIAGNOSIS — R7303 Prediabetes: Secondary | ICD-10-CM

## 2016-06-22 DIAGNOSIS — Z79899 Other long term (current) drug therapy: Secondary | ICD-10-CM | POA: Insufficient documentation

## 2016-06-22 DIAGNOSIS — G43709 Chronic migraine without aura, not intractable, without status migrainosus: Secondary | ICD-10-CM

## 2016-06-22 DIAGNOSIS — Z7984 Long term (current) use of oral hypoglycemic drugs: Secondary | ICD-10-CM | POA: Insufficient documentation

## 2016-06-22 DIAGNOSIS — G43909 Migraine, unspecified, not intractable, without status migrainosus: Secondary | ICD-10-CM | POA: Insufficient documentation

## 2016-06-22 LAB — POCT UA - MICROALBUMIN
CREATININE, POC: 300 mg/dL
Microalbumin Ur, POC: 80 mg/L

## 2016-06-22 LAB — GLUCOSE, POCT (MANUAL RESULT ENTRY): POC GLUCOSE: 99 mg/dL (ref 70–99)

## 2016-06-22 MED ORDER — BUTALBITAL-APAP-CAFFEINE 50-325-40 MG PO TABS: 1.0000 | ORAL_TABLET | Freq: Four times a day (QID) | ORAL | 0 refills | Status: DC | PRN

## 2016-06-22 MED ORDER — HYDROCHLOROTHIAZIDE 25 MG PO TABS: 25.0000 mg | ORAL_TABLET | Freq: Every day | ORAL | 1 refills | Status: DC

## 2016-06-22 MED ORDER — METFORMIN HCL ER 500 MG PO TB24: 500.0000 mg | ORAL_TABLET | Freq: Every day | ORAL | 3 refills | Status: DC

## 2016-06-22 MED ORDER — ACETAMINOPHEN 500 MG PO TABS: 1000.0000 mg | ORAL_TABLET | Freq: Three times a day (TID) | ORAL | 0 refills | Status: DC | PRN

## 2016-06-22 MED FILL — METFORMIN HCL ER 500 MG TAB: 500 | 30 days supply | Qty: 30 | Fill #0

## 2016-06-22 MED FILL — HYDROCHLOROTHIAZIDE 25 MG T: 25 | 30 days supply | Qty: 30 | Fill #0

## 2016-06-22 NOTE — Progress Notes (Signed)
Subjective:  Patient ID: Douglas Bridges, male    DOB: Jul 31, 1990  Age: 26 y.o. MRN: [id number]  CC: Diabetes   HPI Douglas ModenaLedale E Olsen presents for  for follow up of diabetes. Symptoms: none. Patient denies foot ulcerations, hyperglycemia, paresthesia of the feet, polydipsia, polyuria, visual disturbances and vomitting.  Evaluation to date has been included: fasting blood sugar, fasting lipid panel and hemoglobin A1C.  Home sugars: BGs range between 90 and low 100's. Treatment to date: Continued metformin which has been effective. He does report experiencing nausea and diarrhea with metformin use.  BP elevated in office today he reports being without his blood pressure medication for 3 days. He denies any CP, SOB, or swelling of the BLE. History of migraine headaches he is requesting medication refill.   Outpatient Medications Prior to Visit  Medication Sig Dispense Refill  . amLODipine (NORVASC) 5 MG tablet Take 1 tablet (5 mg total) by mouth daily. 90 tablet 3  . amoxicillin (AMOXIL) 500 MG capsule Take 1 capsule (500 mg total) by mouth 2 (two) times daily. 20 capsule 0  . carbamide peroxide (DEBROX) 6.5 % otic solution Place 10 drops into both ears 2 (two) times daily. For 4 days. 15 mL 0  . 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 30 tablet 11  . fluticasone (FLONASE) 50 MCG/ACT nasal spray Place 2 sprays into both nostrils daily. (Patient not taking: Reported on 05/13/2016) 16 g 0  . gabapentin (NEURONTIN) 300 MG capsule Take 1 capsule (300 mg total) by mouth at bedtime. 20 capsule 0  . glucose blood test strip Use as instructed 100 each 12  . HYDROcodone-acetaminophen (NORCO/VICODIN) 5-325 MG tablet Take 1 tablet by mouth every 6 (six) hours as needed for moderate pain. (Patient not taking: Reported on 02/11/2016) 20 tablet 0  . ibuprofen (ADVIL,MOTRIN) 200 MG tablet Take 400 mg by mouth every 6 (six) hours as needed for moderate  pain.    . Investigational - Study Medication Study name: HPTN 083  Additional study details: Cabotegravir 30 mg or placebo daily 1 each prn  . naproxen (NAPROSYN) 500 MG tablet Take 1 tablet (500 mg total) by mouth 2 (two) times daily with a meal. For 6 days. Then take 1 tablet as needed daily with meal for 8 days. 20 tablet 0  . omeprazole (PRILOSEC) 20 MG capsule Take 1 capsule (20 mg total) by mouth daily. 30 capsule 1  . pravastatin (PRAVACHOL) 10 MG tablet Take 1 tablet (10 mg total) by mouth daily. 30 tablet 2  . Vitamin D, Ergocalciferol, (DRISDOL) 50000 units CAPS capsule Take 1 capsule (50,000 Units total) by mouth every 7 (seven) days. 12 capsule 0  . butalbital-acetaminophen-caffeine (FIORICET, ESGIC) 50-325-40 MG tablet Take 1 tablet by mouth every 6 (six) hours as needed for migraine. 40 tablet 0  . hydrochlorothiazide (HYDRODIURIL) 25 MG tablet Take 1 tablet (25 mg total) by mouth daily. 30 tablet 1  . metFORMIN (GLUCOPHAGE) 500 MG tablet Take 1 tablet (500 mg total) by mouth daily with breakfast. 30 tablet 2   No facility-administered medications prior to visit.     ROS Review of Systems  Constitutional: Negative.   Eyes: Negative.   Respiratory: Negative.   Cardiovascular: Negative.   Gastrointestinal: Negative.   Endocrine: Negative for polydipsia, polyphagia and polyuria.  Skin: Negative.   Neurological: Negative.    Objective:  BP (!) 142/84 (BP Location: Left Arm, Patient Position: Sitting, Cuff  Size: Normal)   Pulse 68   Temp 98.3 F (36.8 C) (Oral)   Resp 18   Ht 6\' 1"  (1.854 m)   Wt 233 lb 3.2 oz (105.8 kg)   SpO2 100%   BMI 30.77 kg/m   BP/Weight 06/22/2016 06/15/2016 06/01/2016  Systolic BP 142 128 121  Diastolic BP 84 91 85  Wt. (Lbs) 233.2 234.25 232.5  BMI 30.77 30.91 30.67     Physical Exam  Constitutional: He is oriented to person, place, and time. He appears well-developed and well-nourished.  Eyes: Conjunctivae are normal. Pupils are equal,  round, and reactive to light.  Neck: Normal range of motion. Neck supple. No JVD present.  Cardiovascular: Normal rate, regular rhythm, normal heart sounds and intact distal pulses.   Pulmonary/Chest: Effort normal and breath sounds normal.  Abdominal: Soft. Bowel sounds are normal.  Neurological: He is alert and oriented to person, place, and time.  Skin: Skin is warm and dry.  Psychiatric: He has a normal mood and affect.  Nursing note and vitals reviewed.  Assessment & Plan:   Problem List Items Addressed This Visit      Cardiovascular and Mediastinum   Migraines   Relevant Medications   butalbital-acetaminophen-caffeine (FIORICET, ESGIC) 50-325-40 MG tablet   acetaminophen (TYLENOL) 500 MG tablet    Other Visit Diagnoses    Prediabetes    -  Primary   Change metformin formulation to extended release to reduce side effects and promote adherence.    Follow up with PCP in 2 months   Relevant Medications   metFORMIN (GLUCOPHAGE-XR) 500 MG 24 hr tablet   Other Relevant Orders   Glucose (CBG) (Completed)   POCT UA - Microalbumin (Completed)   Essential hypertension       BP elevated in office today. Pt.reports being without meds for 3 days.   Schedule BP recheck in 2 weeks with clinic RN.   If BP is greater than 90/60 (MAP 65 or greater) but not less than 130/80 may    Amlodipine 2.5 mg QD and recheck in another 2 weeks with clinic RN.    Relevant Medications   hydrochlorothiazide (HYDRODIURIL) 25 MG tablet   Other Relevant Orders   POCT UA - Microalbumin (Completed)      Meds ordered this encounter  Medications  . metFORMIN (GLUCOPHAGE-XR) 500 MG 24 hr tablet    Sig: Take 1 tablet (500 mg total) by mouth daily with breakfast.    Dispense:  30 tablet    Refill:  3    Order Specific Question:   Supervising Provider    Answer:   Quentin Angst [id number]  . hydrochlorothiazide (HYDRODIURIL) 25 MG tablet    Sig: Take 1 tablet (25 mg total) by mouth daily.     Dispense:  30 tablet    Refill:  1    Order Specific Question:   Supervising Provider    Answer:   Quentin Angst [id number]  . butalbital-acetaminophen-caffeine (FIORICET, ESGIC) 50-325-40 MG tablet    Sig: Take 1 tablet by mouth every 6 (six) hours as needed for migraine.    Dispense:  40 tablet    Refill:  0    Order Specific Question:   Supervising Provider    Answer:   Quentin Angst [id number]  . acetaminophen (TYLENOL) 500 MG tablet    Sig: Take 2 tablets (1,000 mg total) by mouth every 8 (eight) hours as needed for headache.    Dispense:  30 tablet  Refill:  0    Order Specific Question:   Supervising Provider    Answer:   Quentin Angst [[id number]]    Follow-up: Return in about 2 weeks (around 07/06/2016) for BP check with clinic RN.  Lizbeth Bark FNP

## 2016-06-22 NOTE — Progress Notes (Signed)
JAPatient is here for f/up  

## 2016-06-22 NOTE — Patient Instructions (Addendum)
Prediabetes Eating Plan Prediabetes-also called impaired glucose tolerance or impaired fasting glucose-is a condition that causes blood sugar (blood glucose) levels to be higher than normal. Following a healthy diet can help to keep prediabetes under control. It can also help to lower the risk of type 2 diabetes and heart disease, which are increased in people who have prediabetes. Along with regular exercise, a healthy diet:  Promotes weight loss.  Helps to control blood sugar levels.  Helps to improve the way that the body uses insulin.  What do I need to know about this eating plan?  Use the glycemic index (GI) to plan your meals. The index tells you how quickly a food will raise your blood sugar. Choose low-GI foods. These foods take a longer time to raise blood sugar.  Pay close attention to the amount of carbohydrates in the food that you eat. Carbohydrates increase blood sugar levels.  Keep track of how many calories you take in. Eating the right amount of calories will help you to achieve a healthy weight. Losing about 7 percent of your starting weight can help to prevent type 2 diabetes.  You may want to follow a Mediterranean diet. This diet includes a lot of vegetables, lean meats or fish, whole grains, fruits, and healthy oils and fats. What foods can I eat? Grains Whole grains, such as whole-wheat or whole-grain breads, crackers, cereals, and pasta. Unsweetened oatmeal. Bulgur. Barley. Quinoa. Brown rice. Corn or whole-wheat flour tortillas or taco shells. Vegetables Lettuce. Spinach. Peas. Beets. Cauliflower. Cabbage. Broccoli. Carrots. Tomatoes. Squash. Eggplant. Herbs. Peppers. Onions. Cucumbers. Brussels sprouts. Fruits Berries. Bananas. Apples. Oranges. Grapes. Papaya. Mango. Pomegranate. Kiwi. Grapefruit. Cherries. Meats and Other Protein Sources Seafood. Lean meats, such as chicken and [place] or lean cuts of pork and beef. Tofu. Eggs. Nuts. Beans. Dairy Low-fat or  fat-free dairy products, such as yogurt, cottage cheese, and cheese. Beverages Water. Tea. Coffee. Sugar-free or diet soda. Seltzer water. Milk. Milk alternatives, such as soy or almond milk. Condiments Mustard. Relish. Low-fat, low-sugar ketchup. Low-fat, low-sugar barbecue sauce. Low-fat or fat-free mayonnaise. Sweets and Desserts Sugar-free or low-fat pudding. Sugar-free or low-fat ice cream and other frozen treats. Fats and Oils Avocado. Walnuts. Olive oil. The items listed above may not be a complete list of recommended foods or beverages. Contact your dietitian for more options. What foods are not recommended? Grains Refined white flour and flour products, such as bread, pasta, snack foods, and cereals. Beverages Sweetened drinks, such as sweet iced tea and soda. Sweets and Desserts Baked goods, such as cake, cupcakes, pastries, cookies, and cheesecake. The items listed above may not be a complete list of foods and beverages to avoid. Contact your dietitian for more information. This information is not intended to replace advice given to you by your health care provider. Make sure you discuss any questions you have with your health care provider. Document Released: 05/13/2014 Document Revised: 06/04/2015 Document Reviewed: 01/22/2014 Elsevier Interactive Patient Education  2017 [employer]  Blood Glucose Monitoring, Adult Monitoring your blood sugar (glucose) helps you manage your diabetes. It also helps you and your health care provider determine how well your diabetes management plan is working. Blood glucose monitoring involves checking your blood glucose as often as directed, and keeping a record (log) of your results over time. Why should I monitor my blood glucose? Checking your blood glucose regularly can:  Help you understand how food, exercise, illnesses, and medicines affect your blood glucose.  Let you know what your  blood glucose is at any time. You can quickly tell  if you are having low blood glucose (hypoglycemia) or high blood glucose (hyperglycemia).  Help you and your health care provider adjust your medicines as needed.  When should I check my blood glucose? Follow instructions from your health care provider about how often to check your blood glucose. This may depend on:  The type of diabetes you have.  How well-controlled your diabetes is.  Medicines you are taking.  If you have type 1 diabetes:  Check your blood glucose at least 2 times a day.  Also check your blood glucose: ? Before every insulin injection. ? Before and after exercise. ? Between meals. ? 2 hours after a meal. ? Occasionally between 2:00 a.m. and 3:00 a.m., as directed. ? Before potentially dangerous tasks, like driving or using heavy machinery. ? At bedtime.  You may need to check your blood glucose more often, up to 6-10 times a day: ? If you use an insulin pump. ? If you need multiple daily injections (MDI). ? If your diabetes is not well-controlled. ? If you are ill. ? If you have a history of severe hypoglycemia. ? If you have a history of not knowing when your blood glucose is getting low (hypoglycemia unawareness). If you have type 2 diabetes:  If you take insulin or other diabetes medicines, check your blood glucose at least 2 times a day.  If you are on intensive insulin therapy, check your blood glucose at least 4 times a day. Occasionally, you may also need to check between 2:00 a.m. and 3:00 a.m., as directed.  Also check your blood glucose: ? Before and after exercise. ? Before potentially dangerous tasks, like driving or using heavy machinery.  You may need to check your blood glucose more often if: ? Your medicine is being adjusted. ? Your diabetes is not well-controlled. ? You are ill. What is a blood glucose log?  A blood glucose log is a record of your blood glucose readings. It helps you and your health care provider: ? Look for  patterns in your blood glucose over time. ? Adjust your diabetes management plan as needed.  Every time you check your blood glucose, write down your result and notes about things that may be affecting your blood glucose, such as your diet and exercise for the day.  Most glucose meters store a record of glucose readings in the meter. Some meters allow you to download your records to a computer. How do I check my blood glucose? Follow these steps to get accurate readings of your blood glucose: Supplies needed   Blood glucose meter.  Test strips for your meter. Each meter has its own strips. You must use the strips that come with your meter.  A needle to prick your finger (lancet). Do not use lancets more than once.  A device that holds the lancet (lancing device).  A journal or log book to write down your results. Procedure  Wash your hands with soap and water.  Prick the side of your finger (not the tip) with the lancet. Use a different finger each time.  Gently rub the finger until a small drop of blood appears.  Follow instructions that come with your meter for inserting the test strip, applying blood to the strip, and using your blood glucose meter.  Write down your result and any notes. Alternative testing sites  Some meters allow you to use areas of your body other than  your finger (alternative sites) to test your blood.  If you think you may have hypoglycemia, or if you have hypoglycemia unawareness, do not use alternative sites. Use your finger instead.  Alternative sites may not be as accurate as the fingers, because blood flow is slower in these areas. This means that the result you get may be delayed, and it may be different from the result that you would get from your finger.  The most common alternative sites are: ? Forearm. ? Thigh. ? Palm of the hand. Additional tips  Always keep your supplies with you.  If you have questions or need help, all blood  glucose meters have a 24-hour "hotline" number that you can call. You may also contact your health care provider.  After you use a few boxes of test strips, adjust (calibrate) your blood glucose meter by following instructions that came with your meter. This information is not intended to replace advice given to you by your health care provider. Make sure you discuss any questions you have with your health care provider. Document Released: 12/30/2002 Document Revised: 07/17/2015 Document Reviewed: 06/08/2015 Elsevier Interactive Patient Education  2017 [employer].

## 2016-06-28 ENCOUNTER — Encounter (INDEPENDENT_AMBULATORY_CARE_PROVIDER_SITE_OTHER): Payer: Self-pay | Admitting: *Deleted

## 2016-06-28 VITALS — BP 124/89 | HR 68 | Temp 98.0°F | Wt 232.0 lb

## 2016-06-28 DIAGNOSIS — Z006 Encounter for examination for normal comparison and control in clinical research program: Secondary | ICD-10-CM

## 2016-06-28 LAB — CBC WITH DIFFERENTIAL/PLATELET
BASOS PCT: 1 %
Basophils Absolute: 64 cells/uL (ref 0–200)
EOS ABS: 192 {cells}/uL (ref 15–500)
Eosinophils Relative: 3 %
HEMATOCRIT: 40.6 % (ref 38.5–50.0)
Hemoglobin: 13.4 g/dL (ref 13.2–17.1)
Lymphocytes Relative: 30 %
Lymphs Abs: 1920 cells/uL (ref 850–3900)
MCH: 28.9 pg (ref 27.0–33.0)
MCHC: 33 g/dL (ref 32.0–36.0)
MCV: 87.5 fL (ref 80.0–100.0)
MONO ABS: 512 {cells}/uL (ref 200–950)
MPV: 9.6 fL (ref 7.5–12.5)
Monocytes Relative: 8 %
NEUTROS ABS: 3712 {cells}/uL (ref 1500–7800)
Neutrophils Relative %: 58 %
Platelets: 389 10*3/uL (ref 140–400)
RBC: 4.64 MIL/uL (ref 4.20–5.80)
RDW: 14.3 % (ref 11.0–15.0)
WBC: 6.4 10*3/uL (ref 3.8–10.8)

## 2016-06-28 NOTE — Progress Notes (Signed)
Study: A Phase 2b/3 Double Blind Safety and Efficacy Study of Injectable Cabotegravir compared to Daily Oral Tenofovir Disoproxil Fumarate/Emtricitabine (TDF/FTC), For Pre-Exposure Prophylaxis in HIV-Uninfected Cisgender Men and Transgender Women who have sex with Men.  Medication: Investigational Injectable Cabotegravir/placebo compared to Truvada/placebo. Duration: Around 4 years.  Douglas Bridges is here for week 4. Assessment unchanged since last study visit. Pill count performed #33 remain and he is 100% adherent. HIV rapid confirmed non-reactive. He received $50 gift card for visit. Next appointment scheduled for Tuesday, 6/26 at 2pm.

## 2016-06-29 LAB — COMPREHENSIVE METABOLIC PANEL
ALBUMIN: 4.5 g/dL (ref 3.6–5.1)
ALK PHOS: 57 U/L (ref 40–115)
ALT: 16 U/L (ref 9–46)
AST: 19 U/L (ref 10–40)
BILIRUBIN TOTAL: 0.4 mg/dL (ref 0.2–1.2)
BUN: 9 mg/dL (ref 7–25)
CALCIUM: 9.8 mg/dL (ref 8.6–10.3)
CO2: 27 mmol/L (ref 20–31)
Chloride: 102 mmol/L (ref 98–110)
Creat: 1.26 mg/dL (ref 0.60–1.35)
Glucose, Bld: 80 mg/dL (ref 65–99)
Potassium: 4.1 mmol/L (ref 3.5–5.3)
Sodium: 138 mmol/L (ref 135–146)
TOTAL PROTEIN: 7.6 g/dL (ref 6.1–8.1)

## 2016-06-29 LAB — LIPASE: LIPASE: 13 U/L (ref 7–60)

## 2016-06-29 LAB — HIV ANTIBODY (ROUTINE TESTING W REFLEX): HIV 1&2 Ab, 4th Generation: NONREACTIVE

## 2016-06-29 LAB — CK: Total CK: 400 U/L — ABNORMAL HIGH (ref 44–196)

## 2016-06-29 LAB — PHOSPHORUS: PHOSPHORUS: 2.8 mg/dL (ref 2.5–4.5)

## 2016-06-29 LAB — AMYLASE: Amylase: 34 U/L (ref 21–101)

## 2016-07-05 ENCOUNTER — Encounter (INDEPENDENT_AMBULATORY_CARE_PROVIDER_SITE_OTHER): Payer: Self-pay

## 2016-07-05 VITALS — BP 129/89 | HR 77 | Temp 98.3°F | Wt 228.0 lb

## 2016-07-05 DIAGNOSIS — Z006 Encounter for examination for normal comparison and control in clinical research program: Secondary | ICD-10-CM
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
## 2016-07-05 NOTE — Progress Notes (Unsigned)
S/w ppt and friend who was there for support in exam room. Ppt and friend had questions about HIV prevention meds as well as how meds work for people living with HIV. Ppt was nervous about their first injection visit, however, the visit seemed to go smoothly. Ppt provided with nutrition and goodie bag. Encouraged safer sex practices through STI prevention education.

## 2016-07-07 LAB — HIV ANTIBODY (ROUTINE TESTING W REFLEX): HIV: NONREACTIVE

## 2016-07-11 NOTE — Progress Notes (Unsigned)
Study: A Phase 2b/3 Double Blind Safety and Efficacy Study of Injectable Cabotegravir compared to Daily Oral Tenofovir Disoproxil Fumarate/Emtricitabine (TDF/FTC), For Pre-Exposure Prophylaxis in HIV-Uninfected Cisgender Men and Transgender Women who have sex with Men.  Medication: Investigational Injectable Cabotegravir/placebo compared to Truvada/placebo. Duration: Around 4 years.  Douglas Bridges is here for week 5. He denies any changes since last visit. HIV rapid confirmed non-reactive. He did not return study drug at this visit. I discussed thoroughly that he should only take the blue pill and stop taking the white pill. I told him to start taking blue pills from the study product dispensed at this visit and return the other study product at next visit. He verbalized understanding. He stated he has not missed any pills since last visit. Injection given in right gluteal muscle with no problems. Oral study products dispensed. He received $50 gift card for visit. Next appt on 7/3.

## 2016-07-12 ENCOUNTER — Encounter: Payer: Self-pay | Admitting: *Deleted

## 2016-07-12 VITALS — BP 128/87 | HR 76 | Temp 98.0°F | Wt 227.0 lb

## 2016-07-12 DIAGNOSIS — Z006 Encounter for examination for normal comparison and control in clinical research program: Secondary | ICD-10-CM

## 2016-07-12 LAB — CBC WITH DIFFERENTIAL/PLATELET
BASOS PCT: 1 %
Basophils Absolute: 53 cells/uL (ref 0–200)
EOS PCT: 3 %
Eosinophils Absolute: 159 cells/uL (ref 15–500)
HCT: 39 % (ref 38.5–50.0)
Hemoglobin: 12.9 g/dL — ABNORMAL LOW (ref 13.2–17.1)
LYMPHS PCT: 36 %
Lymphs Abs: 1908 cells/uL (ref 850–3900)
MCH: 29 pg (ref 27.0–33.0)
MCHC: 33.1 g/dL (ref 32.0–36.0)
MCV: 87.6 fL (ref 80.0–100.0)
MONOS PCT: 7 %
MPV: 9.3 fL (ref 7.5–12.5)
Monocytes Absolute: 371 cells/uL (ref 200–950)
NEUTROS ABS: 2809 {cells}/uL (ref 1500–7800)
Neutrophils Relative %: 53 %
Platelets: 368 10*3/uL (ref 140–400)
RBC: 4.45 MIL/uL (ref 4.20–5.80)
RDW: 14.2 % (ref 11.0–15.0)
WBC: 5.3 10*3/uL (ref 3.8–10.8)

## 2016-07-13 LAB — COMPREHENSIVE METABOLIC PANEL
ALT: 16 U/L (ref 9–46)
AST: 19 U/L (ref 10–40)
Albumin: 4.5 g/dL (ref 3.6–5.1)
Alkaline Phosphatase: 61 U/L (ref 40–115)
BILIRUBIN TOTAL: 0.4 mg/dL (ref 0.2–1.2)
BUN: 8 mg/dL (ref 7–25)
CALCIUM: 9.7 mg/dL (ref 8.6–10.3)
CO2: 25 mmol/L (ref 20–31)
CREATININE: 1.3 mg/dL (ref 0.60–1.35)
Chloride: 103 mmol/L (ref 98–110)
GLUCOSE: 86 mg/dL (ref 65–99)
Potassium: 4.1 mmol/L (ref 3.5–5.3)
SODIUM: 139 mmol/L (ref 135–146)
Total Protein: 7.6 g/dL (ref 6.1–8.1)

## 2016-07-13 LAB — CK: CK TOTAL: 363 U/L — AB (ref 44–196)

## 2016-07-13 LAB — LIPASE: LIPASE: 13 U/L (ref 7–60)

## 2016-07-13 LAB — PHOSPHORUS: PHOSPHORUS: 3.4 mg/dL (ref 2.5–4.5)

## 2016-07-13 LAB — AMYLASE: Amylase: 35 U/L (ref 21–101)

## 2016-07-13 LAB — HIV ANTIBODY (ROUTINE TESTING W REFLEX): HIV: NONREACTIVE

## 2016-07-14 NOTE — Progress Notes (Signed)
Study: A Phase 2b/3 Double Blind Safety and Efficacy Study of Injectable Cabotegravir compared to Daily Oral Tenofovir Disoproxil Fumarate/Emtricitabine (TDF/FTC), For Pre-Exposure Prophylaxis in HIV-Uninfected Cisgender Men and Transgender Women who have sex with Men.  Medication: Investigational Injectable Cabotegravir/placebo compared to Truvada/placebo. Duration: Around 4 years.  Douglas Bridges is here for week 6. He denies any issues with injection. Assessment unchanged since last visit. Blood drawn with no problems. He received $50 gift card for visit. Next appointment scheduled for 7/24.

## 2016-08-02 ENCOUNTER — Encounter (INDEPENDENT_AMBULATORY_CARE_PROVIDER_SITE_OTHER): Payer: Self-pay | Admitting: *Deleted

## 2016-08-02 VITALS — BP 125/85 | HR 63 | Temp 97.8°F | Wt 227.0 lb

## 2016-08-02 DIAGNOSIS — Z006 Encounter for examination for normal comparison and control in clinical research program: Secondary | ICD-10-CM

## 2016-08-02 LAB — CBC WITH DIFFERENTIAL/PLATELET
BASOS PCT: 1 %
Basophils Absolute: 60 cells/uL (ref 0–200)
EOS ABS: 180 {cells}/uL (ref 15–500)
Eosinophils Relative: 3 %
HEMATOCRIT: 38.2 % — AB (ref 38.5–50.0)
Hemoglobin: 12.7 g/dL — ABNORMAL LOW (ref 13.2–17.1)
LYMPHS ABS: 2460 {cells}/uL (ref 850–3900)
Lymphocytes Relative: 41 %
MCH: 29.4 pg (ref 27.0–33.0)
MCHC: 33.2 g/dL (ref 32.0–36.0)
MCV: 88.4 fL (ref 80.0–100.0)
MONO ABS: 480 {cells}/uL (ref 200–950)
MPV: 9.5 fL (ref 7.5–12.5)
Monocytes Relative: 8 %
NEUTROS ABS: 2820 {cells}/uL (ref 1500–7800)
Neutrophils Relative %: 47 %
Platelets: 427 10*3/uL — ABNORMAL HIGH (ref 140–400)
RBC: 4.32 MIL/uL (ref 4.20–5.80)
RDW: 14.2 % (ref 11.0–15.0)
WBC: 6 10*3/uL (ref 3.8–10.8)

## 2016-08-02 LAB — PHOSPHORUS: PHOSPHORUS: 3.9 mg/dL (ref 2.5–4.5)

## 2016-08-02 LAB — COMPREHENSIVE METABOLIC PANEL
ALK PHOS: 57 U/L (ref 40–115)
ALT: 13 U/L (ref 9–46)
AST: 18 U/L (ref 10–40)
Albumin: 4.5 g/dL (ref 3.6–5.1)
BILIRUBIN TOTAL: 0.5 mg/dL (ref 0.2–1.2)
BUN: 7 mg/dL (ref 7–25)
CALCIUM: 9.5 mg/dL (ref 8.6–10.3)
CO2: 26 mmol/L (ref 20–31)
Chloride: 102 mmol/L (ref 98–110)
Creat: 1.32 mg/dL (ref 0.60–1.35)
GLUCOSE: 78 mg/dL (ref 65–99)
POTASSIUM: 3.9 mmol/L (ref 3.5–5.3)
Sodium: 136 mmol/L (ref 135–146)
Total Protein: 7.4 g/dL (ref 6.1–8.1)

## 2016-08-02 NOTE — Progress Notes (Signed)
Study: A Phase 2b/3 Double Blind Safety and Efficacy Study of Injectable Cabotegravir compared to Daily Oral Tenofovir Disoproxil Fumarate/Emtricitabine (TDF/FTC), For Pre-Exposure Prophylaxis in HIV-Uninfected Cisgender Men and Transgender Women who have sex with Men.  Medication: Investigational Injectable Cabotegravir/placebo compared to Truvada/placebo. Duration: Around 4 years.  Douglas Bridges is here for week 9 visit. No new complaints or concerns verbalized. Excellent adherence with his oral study medication. Rapid HIV non-reactive. Cabotegravir/placebo injection given (R) gluteal muscle. Site unremarkable. Will return next week for safety visit.

## 2016-08-03 LAB — CK: Total CK: 361 U/L — ABNORMAL HIGH (ref 44–196)

## 2016-08-03 LAB — LIPASE: Lipase: 8 U/L (ref 7–60)

## 2016-08-03 LAB — HIV ANTIBODY (ROUTINE TESTING W REFLEX): HIV 1&2 Ab, 4th Generation: NONREACTIVE

## 2016-08-03 LAB — AMYLASE: AMYLASE: 31 U/L (ref 21–101)

## 2016-08-09 ENCOUNTER — Encounter: Payer: Self-pay | Admitting: *Deleted

## 2016-08-09 VITALS — BP 145/85 | HR 84 | Temp 98.1°F | Wt 227.5 lb

## 2016-08-09 DIAGNOSIS — Z006 Encounter for examination for normal comparison and control in clinical research program: Secondary | ICD-10-CM

## 2016-08-09 LAB — CBC WITH DIFFERENTIAL/PLATELET
BASOS ABS: 57 {cells}/uL (ref 0–200)
Basophils Relative: 1 %
EOS ABS: 114 {cells}/uL (ref 15–500)
Eosinophils Relative: 2 %
HEMATOCRIT: 39.9 % (ref 38.5–50.0)
HEMOGLOBIN: 13.3 g/dL (ref 13.2–17.1)
LYMPHS ABS: 2223 {cells}/uL (ref 850–3900)
LYMPHS PCT: 39 %
MCH: 29.5 pg (ref 27.0–33.0)
MCHC: 33.3 g/dL (ref 32.0–36.0)
MCV: 88.5 fL (ref 80.0–100.0)
MONO ABS: 456 {cells}/uL (ref 200–950)
MPV: 9.2 fL (ref 7.5–12.5)
Monocytes Relative: 8 %
NEUTROS PCT: 50 %
Neutro Abs: 2850 cells/uL (ref 1500–7800)
Platelets: 404 10*3/uL — ABNORMAL HIGH (ref 140–400)
RBC: 4.51 MIL/uL (ref 4.20–5.80)
RDW: 14.3 % (ref 11.0–15.0)
WBC: 5.7 10*3/uL (ref 3.8–10.8)

## 2016-08-09 NOTE — Progress Notes (Signed)
Study: A Phase 2b/3 Double Blind Safety and Efficacy Study of Injectable Cabotegravir compared to Daily Oral Tenofovir Disoproxil Fumarate/Emtricitabine (TDF/FTC), For Pre-Exposure Prophylaxis in HIV-Uninfected Cisgender Men and Transgender Women who have sex with Men.  Medication: Investigational Injectable Cabotegravir/placebo compared to Truvada/placebo. Duration: Around 4 years.  Douglas Bridges is here for week 10. He denies any injection site reaction. No other issues to reports. Blood drawn with no problem. BP elevated. States he is in a rush. Questionnaire completed. He received $50 giftcard for visit and declined condoms. Next appointment scheduled for mid septmeber

## 2016-08-10 LAB — COMPREHENSIVE METABOLIC PANEL
ALBUMIN: 4.5 g/dL (ref 3.6–5.1)
ALT: 11 U/L (ref 9–46)
AST: 18 U/L (ref 10–40)
Alkaline Phosphatase: 61 U/L (ref 40–115)
BILIRUBIN TOTAL: 0.4 mg/dL (ref 0.2–1.2)
BUN: 10 mg/dL (ref 7–25)
CALCIUM: 9.7 mg/dL (ref 8.6–10.3)
CHLORIDE: 102 mmol/L (ref 98–110)
CO2: 22 mmol/L (ref 20–31)
CREATININE: 1.4 mg/dL — AB (ref 0.60–1.35)
Glucose, Bld: 92 mg/dL (ref 65–99)
Potassium: 3.6 mmol/L (ref 3.5–5.3)
Sodium: 139 mmol/L (ref 135–146)
TOTAL PROTEIN: 7.5 g/dL (ref 6.1–8.1)

## 2016-08-10 LAB — AMYLASE: AMYLASE: 39 U/L (ref 21–101)

## 2016-08-10 LAB — CK: Total CK: 377 U/L — ABNORMAL HIGH (ref 44–196)

## 2016-08-10 LAB — PHOSPHORUS: PHOSPHORUS: 2.9 mg/dL (ref 2.5–4.5)

## 2016-08-10 LAB — LIPASE: LIPASE: 17 U/L (ref 7–60)

## 2016-08-10 LAB — HIV ANTIBODY (ROUTINE TESTING W REFLEX): HIV: NONREACTIVE

## 2016-08-17 ENCOUNTER — Ambulatory Visit: Payer: Self-pay | Attending: Family Medicine | Admitting: Family Medicine

## 2016-08-17 DIAGNOSIS — E785 Hyperlipidemia, unspecified: Secondary | ICD-10-CM | POA: Insufficient documentation

## 2016-08-17 DIAGNOSIS — E1169 Type 2 diabetes mellitus with other specified complication: Secondary | ICD-10-CM

## 2016-08-17 DIAGNOSIS — Z7984 Long term (current) use of oral hypoglycemic drugs: Secondary | ICD-10-CM | POA: Insufficient documentation

## 2016-08-17 DIAGNOSIS — G43709 Chronic migraine without aura, not intractable, without status migrainosus: Secondary | ICD-10-CM

## 2016-08-17 DIAGNOSIS — R7303 Prediabetes: Secondary | ICD-10-CM | POA: Insufficient documentation

## 2016-08-17 DIAGNOSIS — I10 Essential (primary) hypertension: Secondary | ICD-10-CM | POA: Insufficient documentation

## 2016-08-17 DIAGNOSIS — G43909 Migraine, unspecified, not intractable, without status migrainosus: Secondary | ICD-10-CM | POA: Insufficient documentation

## 2016-08-17 DIAGNOSIS — Z79899 Other long term (current) drug therapy: Secondary | ICD-10-CM | POA: Insufficient documentation

## 2016-08-18 ENCOUNTER — Telehealth: Payer: Self-pay

## 2016-08-18 ENCOUNTER — Encounter: Payer: Self-pay | Admitting: Family Medicine

## 2016-08-18 MED ORDER — BUTALBITAL-APAP-CAFFEINE 50-325-40 MG PO TABS: 1.0000 | ORAL_TABLET | Freq: Four times a day (QID) | ORAL | 0 refills | Status: DC | PRN

## 2016-08-18 MED ORDER — HYDROCHLOROTHIAZIDE 25 MG PO TABS: 25.0000 mg | ORAL_TABLET | Freq: Every day | ORAL | 1 refills | Status: DC

## 2016-08-18 MED ORDER — PRAVASTATIN SODIUM 10 MG PO TABS: 10.0000 mg | ORAL_TABLET | Freq: Every day | ORAL | 2 refills | Status: DC

## 2016-08-18 MED ORDER — METFORMIN HCL ER 500 MG PO TB24: 500.0000 mg | ORAL_TABLET | Freq: Every day | ORAL | 3 refills | Status: DC

## 2016-08-18 MED ORDER — AMLODIPINE BESYLATE 5 MG PO TABS: 5.0000 mg | ORAL_TABLET | Freq: Every day | ORAL | 3 refills | Status: DC

## 2016-08-18 NOTE — Progress Notes (Signed)
Patient is here for HTN  Med refills
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
## 2016-08-18 NOTE — Telephone Encounter (Signed)
CMA call regarding prescription is ready to pick up at front desk   Patient verify DOB  Patient was aware and understood

## 2016-08-23 ENCOUNTER — Encounter: Payer: Self-pay | Admitting: Neurology

## 2016-09-27 ENCOUNTER — Encounter: Payer: Self-pay | Admitting: *Deleted

## 2016-09-27 ENCOUNTER — Encounter (INDEPENDENT_AMBULATORY_CARE_PROVIDER_SITE_OTHER): Payer: Self-pay | Admitting: *Deleted

## 2016-09-27 VITALS — BP 134/92 | HR 72 | Temp 98.1°F | Wt 230.8 lb

## 2016-09-27 DIAGNOSIS — Z006 Encounter for examination for normal comparison and control in clinical research program: Secondary | ICD-10-CM

## 2016-09-28 LAB — CBC WITH DIFFERENTIAL/PLATELET
BASOS ABS: 91 {cells}/uL (ref 0–200)
Basophils Relative: 1.3 %
EOS ABS: 168 {cells}/uL (ref 15–500)
Eosinophils Relative: 2.4 %
HCT: 39.7 % (ref 38.5–50.0)
Hemoglobin: 13.2 g/dL (ref 13.2–17.1)
Lymphs Abs: 2772 cells/uL (ref 850–3900)
MCH: 28.8 pg (ref 27.0–33.0)
MCHC: 33.2 g/dL (ref 32.0–36.0)
MCV: 86.7 fL (ref 80.0–100.0)
MPV: 10.4 fL (ref 7.5–12.5)
Monocytes Relative: 7.8 %
NEUTROS PCT: 48.9 %
Neutro Abs: 3423 cells/uL (ref 1500–7800)
PLATELETS: 365 10*3/uL (ref 140–400)
RBC: 4.58 10*6/uL (ref 4.20–5.80)
RDW: 13.1 % (ref 11.0–15.0)
TOTAL LYMPHOCYTE: 39.6 %
WBC: 7 10*3/uL (ref 3.8–10.8)
WBCMIX: 546 {cells}/uL (ref 200–950)

## 2016-09-28 LAB — COMPREHENSIVE METABOLIC PANEL
AG Ratio: 1.5 (calc) (ref 1.0–2.5)
ALKALINE PHOSPHATASE (APISO): 61 U/L (ref 40–115)
ALT: 12 U/L (ref 9–46)
AST: 17 U/L (ref 10–40)
Albumin: 4.4 g/dL (ref 3.6–5.1)
BUN / CREAT RATIO: 5 (calc) — AB (ref 6–22)
BUN: 5 mg/dL — ABNORMAL LOW (ref 7–25)
CO2: 26 mmol/L (ref 20–32)
CREATININE: 1.1 mg/dL (ref 0.60–1.35)
Calcium: 9.8 mg/dL (ref 8.6–10.3)
Chloride: 103 mmol/L (ref 98–110)
GLUCOSE: 95 mg/dL (ref 65–99)
Globulin: 2.9 g/dL (calc) (ref 1.9–3.7)
Potassium: 3.9 mmol/L (ref 3.5–5.3)
SODIUM: 138 mmol/L (ref 135–146)
Total Bilirubin: 0.3 mg/dL (ref 0.2–1.2)
Total Protein: 7.3 g/dL (ref 6.1–8.1)

## 2016-09-28 LAB — CK: CK TOTAL: 240 U/L — AB (ref 44–196)

## 2016-09-28 LAB — LIPASE: Lipase: 14 U/L (ref 7–60)

## 2016-09-28 LAB — AMYLASE: Amylase: 37 U/L (ref 21–101)

## 2016-09-28 LAB — PHOSPHORUS: Phosphorus: 3.2 mg/dL (ref 2.5–4.5)

## 2016-09-28 LAB — HIV ANTIBODY (ROUTINE TESTING W REFLEX): HIV 1&2 Ab, 4th Generation: NONREACTIVE

## 2016-09-28 NOTE — Progress Notes (Signed)
Study: A Phase 2b/3 Double Blind Safety and Efficacy Study of Injectable Cabotegravir compared to Daily Oral Tenofovir Disoproxil Fumarate/Emtricitabine (TDF/FTC), For Pre-Exposure Prophylaxis in HIV-Uninfected Cisgender Men and Transgender Women who have sex with Men.  Medication: Investigational Injectable Cabotegravir/placebo compared to Truvada/placebo. Duration: Around 4 years.  Douglas Bridges is here for week 17. After verifying the correct version I explained/reviewed version 2.0 informed consent in the language that he understood. Risk, benefits, responsibilities, and other options were reviewed. I answered his questions. Comprehension was assessed. He was given adequate time to consider his options. He verbalized understanding and signed the consent witnessed by me. Assessment reveals no findings. He denies any changes since last study visit. Did not return oral study IP. HIV Rapid confirmed non-reactive. Injection given in right gluteal muscle with no problem. Oral study IP dispensed. He received $50 gift card for visit . Next appointment scheduled for 10/10/16.

## 2016-10-05 MED FILL — AMLODIPINE BESYLATE 5 MG TA: 5 | 30 days supply | Qty: 30 | Fill #1

## 2016-10-05 MED FILL — HYDROCHLOROTHIAZIDE 25 MG T: 25 | 30 days supply | Qty: 30 | Fill #1

## 2016-10-10 ENCOUNTER — Encounter (INDEPENDENT_AMBULATORY_CARE_PROVIDER_SITE_OTHER): Payer: Self-pay | Admitting: *Deleted

## 2016-10-10 VITALS — BP 120/88 | HR 96 | Temp 98.4°F | Wt 220.0 lb

## 2016-10-10 DIAGNOSIS — Z006 Encounter for examination for normal comparison and control in clinical research program: Secondary | ICD-10-CM

## 2016-10-10 NOTE — Progress Notes (Signed)
Study: A Phase 2b/3 Double Blind Safety and Efficacy Study of Injectable Cabotegravir compared to Daily Oral Tenofovir Disoproxil Fumarate/Emtricitabine (TDF/FTC), For Pre-Exposure Prophylaxis in HIV-Uninfected Cisgender Men and Transgender Women who have sex with Men.  Medication: Investigational Injectable Cabotegravir/placebo compared to Truvada/placebo. Duration: Around 4 years.  Douglas Bridges is here for week 17. He denies any injection site reaction. Assessment unchanged since last study visit. HIV rapid confirmed non-reactive. He received $50 gift card for visit. Next appointment scheduled for 11/7.

## 2016-10-11 LAB — CBC WITH DIFFERENTIAL/PLATELET
BASOS PCT: 1.5 %
Basophils Absolute: 93 cells/uL (ref 0–200)
EOS ABS: 211 {cells}/uL (ref 15–500)
Eosinophils Relative: 3.4 %
HEMATOCRIT: 40.9 % (ref 38.5–50.0)
Hemoglobin: 13.8 g/dL (ref 13.2–17.1)
Lymphs Abs: 2139 cells/uL (ref 850–3900)
MCH: 29.1 pg (ref 27.0–33.0)
MCHC: 33.7 g/dL (ref 32.0–36.0)
MCV: 86.1 fL (ref 80.0–100.0)
MPV: 10.7 fL (ref 7.5–12.5)
Monocytes Relative: 7.3 %
Neutro Abs: 3305 cells/uL (ref 1500–7800)
Neutrophils Relative %: 53.3 %
PLATELETS: 319 10*3/uL (ref 140–400)
RBC: 4.75 10*6/uL (ref 4.20–5.80)
RDW: 12.8 % (ref 11.0–15.0)
TOTAL LYMPHOCYTE: 34.5 %
WBC: 6.2 10*3/uL (ref 3.8–10.8)
WBCMIX: 453 {cells}/uL (ref 200–950)

## 2016-10-11 LAB — COMPREHENSIVE METABOLIC PANEL
AG RATIO: 1.6 (calc) (ref 1.0–2.5)
ALT: 15 U/L (ref 9–46)
AST: 20 U/L (ref 10–40)
Albumin: 4.7 g/dL (ref 3.6–5.1)
Alkaline phosphatase (APISO): 71 U/L (ref 40–115)
BUN: 9 mg/dL (ref 7–25)
CO2: 28 mmol/L (ref 20–32)
CREATININE: 1.09 mg/dL (ref 0.60–1.35)
Calcium: 9.8 mg/dL (ref 8.6–10.3)
Chloride: 101 mmol/L (ref 98–110)
GLUCOSE: 116 mg/dL — AB (ref 65–99)
Globulin: 3 g/dL (calc) (ref 1.9–3.7)
Potassium: 3.5 mmol/L (ref 3.5–5.3)
SODIUM: 138 mmol/L (ref 135–146)
TOTAL PROTEIN: 7.7 g/dL (ref 6.1–8.1)
Total Bilirubin: 0.5 mg/dL (ref 0.2–1.2)

## 2016-10-11 LAB — LIPASE: Lipase: 11 U/L (ref 7–60)

## 2016-10-11 LAB — PHOSPHORUS: PHOSPHORUS: 3.6 mg/dL (ref 2.5–4.5)

## 2016-10-11 LAB — CK: Total CK: 306 U/L — ABNORMAL HIGH (ref 44–196)

## 2016-10-11 LAB — HIV ANTIBODY (ROUTINE TESTING W REFLEX): HIV 1&2 Ab, 4th Generation: NONREACTIVE

## 2016-10-11 LAB — AMYLASE: Amylase: 33 U/L (ref 21–101)

## 2016-10-24 ENCOUNTER — Encounter: Payer: Self-pay | Admitting: Neurology

## 2016-10-24 ENCOUNTER — Ambulatory Visit (INDEPENDENT_AMBULATORY_CARE_PROVIDER_SITE_OTHER): Payer: Self-pay | Admitting: Neurology

## 2016-10-24 VITALS — BP 124/100 | HR 89 | Ht 74.0 in | Wt 225.2 lb

## 2016-10-24 DIAGNOSIS — G44219 Episodic tension-type headache, not intractable: Secondary | ICD-10-CM

## 2016-10-24 MED ORDER — NORTRIPTYLINE HCL 25 MG PO CAPS: 25.0000 mg | ORAL_CAPSULE | Freq: Every day | ORAL | 3 refills | Status: DC
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
## 2016-10-24 NOTE — Patient Instructions (Signed)
1.  Start nortriptyline  at bedtime.  If doing well, refill prescription in 4 weeks.  If still not improved, contact me prior to refill and I will increase dose. 2.  Take ibuprofen only for headache, limit to no more than 2 days out of the week. 3.  Start exercising, stop soda, increase water 4.  Follow up in 3 months.

## 2016-10-25 ENCOUNTER — Encounter (HOSPITAL_COMMUNITY): Payer: Self-pay | Admitting: *Deleted

## 2016-10-25 ENCOUNTER — Emergency Department (HOSPITAL_COMMUNITY)
Admission: EM | Admit: 2016-10-25 | Discharge: 2016-10-25 | Disposition: A | Discharge status: Home / Self Care | Payer: Self-pay | Attending: Emergency Medicine | Admitting: Emergency Medicine

## 2016-10-25 DIAGNOSIS — Z79899 Other long term (current) drug therapy: Secondary | ICD-10-CM | POA: Insufficient documentation

## 2016-10-25 DIAGNOSIS — F17290 Nicotine dependence, other tobacco product, uncomplicated: Secondary | ICD-10-CM | POA: Insufficient documentation

## 2016-10-25 DIAGNOSIS — J020 Streptococcal pharyngitis: Secondary | ICD-10-CM | POA: Insufficient documentation

## 2016-10-25 DIAGNOSIS — Z7984 Long term (current) use of oral hypoglycemic drugs: Secondary | ICD-10-CM | POA: Insufficient documentation

## 2016-10-25 LAB — RAPID STREP SCREEN (MED CTR MEBANE ONLY): STREPTOCOCCUS, GROUP A SCREEN (DIRECT): POSITIVE — AB

## 2016-10-25 MED ORDER — PENICILLIN G BENZATHINE 1200000 UNIT/2ML IM SUSP
1.2000 10*6.[IU] | Freq: Once | INTRAMUSCULAR | Status: AC
  Administered 2016-10-25: 1.2 10*6.[IU] via INTRAMUSCULAR
  Filled 2016-10-25: qty 2

## 2016-10-25 MED ORDER — DEXAMETHASONE SODIUM PHOSPHATE 10 MG/ML IJ SOLN
10.0000 mg | Freq: Once | INTRAMUSCULAR | Status: AC
  Administered 2016-10-25: 10 mg via INTRAMUSCULAR
  Filled 2016-10-25: qty 1
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
## 2016-10-25 NOTE — ED Triage Notes (Signed)
Pt complains of swollen tonsils, difficulty breathing since this morning. Pt states he has 10/10 pain in his throat.

## 2016-10-25 NOTE — Discharge Instructions (Addendum)
Your strep test was positive. This has been treated in the ED today and can take a few days to fully resolve the infection.  Hand washing: Wash your hands throughout the day, but especially before and after touching the face, using the restroom, sneezing, coughing, or touching surfaces that have been coughed or sneezed upon. Hydration: Symptoms will be intensified and complicated by dehydration. Dehydration can also extend the duration of symptoms. Drink plenty of fluids and get plenty of rest. You should be drinking at least half a liter of water an hour to stay hydrated. Electrolyte drinks are also encouraged. You should be drinking enough fluids to make your urine light yellow, almost clear. If this is not the case, you are not drinking enough water. Please note that some of the treatments indicated below will not be effective if you are not adequately hydrated. Pain or fever: Ibuprofen, Naproxen, or Tylenol for pain or fever.  Congestion: Plain Mucinex may help relieve congestion. Saline sinus rinses and saline nasal sprays may also help relieve congestion. If you do not have heart problems or an allergy to such medications, you may also try phenylephrine or Sudafed. Sore throat: Warm liquids or Chloraseptic spray may help soothe a sore throat. Gargle twice a day with a salt water solution made from a half teaspoon of salt in a cup of warm water.  Follow up: Follow up with a primary care provider, as needed, for any future management of this issue. Return: Return to the ED for worsening symptoms.

## 2016-11-16 ENCOUNTER — Encounter (INDEPENDENT_AMBULATORY_CARE_PROVIDER_SITE_OTHER): Payer: Self-pay | Admitting: *Deleted

## 2016-11-16 VITALS — BP 140/99 | HR 112 | Temp 98.5°F | Wt 227.8 lb

## 2016-11-16 DIAGNOSIS — Z006 Encounter for examination for normal comparison and control in clinical research program: Secondary | ICD-10-CM

## 2016-11-16 NOTE — Progress Notes (Signed)
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 He was seen in the ED about 2 weeks ago for strep throat and was treated with PCN. He says he is much better now and denies any other new problems. He was given an injection in his rt buttock wo/problem. He denies any new recent risky sexual activity or symptoms suggestive of acute HIV. He will be returning 11/26.

## 2016-11-17 LAB — COMPREHENSIVE METABOLIC PANEL
AG RATIO: 1.3 (calc) (ref 1.0–2.5)
ALBUMIN MSPROF: 4.5 g/dL (ref 3.6–5.1)
ALT: 18 U/L (ref 9–46)
AST: 25 U/L (ref 10–40)
Alkaline phosphatase (APISO): 67 U/L (ref 40–115)
BUN: 9 mg/dL (ref 7–25)
CHLORIDE: 100 mmol/L (ref 98–110)
CO2: 29 mmol/L (ref 20–32)
CREATININE: 1.2 mg/dL (ref 0.60–1.35)
Calcium: 10 mg/dL (ref 8.6–10.3)
GLOBULIN: 3.5 g/dL (ref 1.9–3.7)
GLUCOSE: 83 mg/dL (ref 65–99)
Potassium: 3.7 mmol/L (ref 3.5–5.3)
SODIUM: 138 mmol/L (ref 135–146)
TOTAL PROTEIN: 8 g/dL (ref 6.1–8.1)
Total Bilirubin: 0.4 mg/dL (ref 0.2–1.2)

## 2016-11-17 LAB — CBC WITH DIFFERENTIAL/PLATELET
BASOS PCT: 1.1 %
Basophils Absolute: 79 cells/uL (ref 0–200)
EOS PCT: 2.2 %
Eosinophils Absolute: 158 cells/uL (ref 15–500)
HEMATOCRIT: 43.2 % (ref 38.5–50.0)
HEMOGLOBIN: 14.3 g/dL (ref 13.2–17.1)
LYMPHS ABS: 3362 {cells}/uL (ref 850–3900)
MCH: 28.3 pg (ref 27.0–33.0)
MCHC: 33.1 g/dL (ref 32.0–36.0)
MCV: 85.5 fL (ref 80.0–100.0)
MPV: 10.1 fL (ref 7.5–12.5)
Monocytes Relative: 7.7 %
NEUTROS ABS: 3046 {cells}/uL (ref 1500–7800)
Neutrophils Relative %: 42.3 %
Platelets: 461 10*3/uL — ABNORMAL HIGH (ref 140–400)
RBC: 5.05 10*6/uL (ref 4.20–5.80)
RDW: 12.6 % (ref 11.0–15.0)
Total Lymphocyte: 46.7 %
WBC mixed population: 554 cells/uL (ref 200–950)
WBC: 7.2 10*3/uL (ref 3.8–10.8)

## 2016-11-17 LAB — CK: Total CK: 652 U/L — ABNORMAL HIGH (ref 44–196)

## 2016-11-17 LAB — HIV ANTIBODY (ROUTINE TESTING W REFLEX): HIV: NONREACTIVE

## 2016-11-17 LAB — AMYLASE: AMYLASE: 37 U/L (ref 21–101)

## 2016-11-17 LAB — PHOSPHORUS: Phosphorus: 4 mg/dL (ref 2.5–4.5)

## 2016-11-17 LAB — LIPASE: Lipase: 12 U/L (ref 7–60)

## 2016-12-05 ENCOUNTER — Encounter (INDEPENDENT_AMBULATORY_CARE_PROVIDER_SITE_OTHER): Payer: Self-pay | Admitting: *Deleted

## 2016-12-05 VITALS — BP 129/89 | HR 65 | Temp 98.1°F | Wt 228.2 lb

## 2016-12-05 DIAGNOSIS — Z006 Encounter for examination for normal comparison and control in clinical research program: Secondary | ICD-10-CM

## 2016-12-05 NOTE — Progress Notes (Signed)
Study: A Phase 2b/3 Double Blind Safety and Efficacy Study of Injectable Cabotegravir compared to Daily Oral Tenofovir Disoproxil Fumarate/Emtricitabine (TDF/FTC), For Pre-Exposure Prophylaxis in HIV-Uninfected Cisgender Men and Transgender Women who have sex with Men.  Medication: Investigational Injectable Cabotegravir/placebo compared to Truvada/placebo. Duration: Around 4 years.  Douglas Bridges is here for week 27 visit. Denies any problems with his last injection. No new complaints or concerns. States good adherence with his oral study medication. Next study visit scheduled for 01/16/17 at 10:30am.

## 2016-12-06 LAB — COMPREHENSIVE METABOLIC PANEL
AG RATIO: 1.4 (calc) (ref 1.0–2.5)
ALT: 20 U/L (ref 9–46)
AST: 21 U/L (ref 10–40)
Albumin: 4.4 g/dL (ref 3.6–5.1)
Alkaline phosphatase (APISO): 70 U/L (ref 40–115)
BUN: 8 mg/dL (ref 7–25)
CO2: 29 mmol/L (ref 20–32)
CREATININE: 1.1 mg/dL (ref 0.60–1.35)
Calcium: 10 mg/dL (ref 8.6–10.3)
Chloride: 102 mmol/L (ref 98–110)
Globulin: 3.2 g/dL (calc) (ref 1.9–3.7)
Glucose, Bld: 91 mg/dL (ref 65–99)
Potassium: 4 mmol/L (ref 3.5–5.3)
Sodium: 138 mmol/L (ref 135–146)
TOTAL PROTEIN: 7.6 g/dL (ref 6.1–8.1)
Total Bilirubin: 0.6 mg/dL (ref 0.2–1.2)

## 2016-12-06 LAB — CBC WITH DIFFERENTIAL/PLATELET
BASOS PCT: 1 %
Basophils Absolute: 61 cells/uL (ref 0–200)
Eosinophils Absolute: 128 cells/uL (ref 15–500)
Eosinophils Relative: 2.1 %
HEMATOCRIT: 40.7 % (ref 38.5–50.0)
Hemoglobin: 14 g/dL (ref 13.2–17.1)
LYMPHS ABS: 2269 {cells}/uL (ref 850–3900)
MCH: 29.3 pg (ref 27.0–33.0)
MCHC: 34.4 g/dL (ref 32.0–36.0)
MCV: 85.1 fL (ref 80.0–100.0)
MPV: 10.1 fL (ref 7.5–12.5)
Monocytes Relative: 7.9 %
Neutro Abs: 3160 cells/uL (ref 1500–7800)
Neutrophils Relative %: 51.8 %
PLATELETS: 398 10*3/uL (ref 140–400)
RBC: 4.78 10*6/uL (ref 4.20–5.80)
RDW: 13.1 % (ref 11.0–15.0)
Total Lymphocyte: 37.2 %
WBC: 6.1 10*3/uL (ref 3.8–10.8)
WBCMIX: 482 {cells}/uL (ref 200–950)

## 2016-12-06 LAB — PHOSPHORUS: Phosphorus: 3.4 mg/dL (ref 2.5–4.5)

## 2016-12-06 LAB — AMYLASE: AMYLASE: 37 U/L (ref 21–101)

## 2016-12-06 LAB — HIV ANTIBODY (ROUTINE TESTING W REFLEX): HIV 1&2 Ab, 4th Generation: NONREACTIVE

## 2016-12-06 LAB — LIPASE: LIPASE: 10 U/L (ref 7–60)

## 2016-12-06 LAB — CK: Total CK: 306 U/L — ABNORMAL HIGH (ref 44–196)

## 2016-12-09 ENCOUNTER — Ambulatory Visit: Payer: Self-pay | Admitting: Neurology

## 2016-12-21 ENCOUNTER — Encounter (HOSPITAL_COMMUNITY): Payer: Self-pay | Admitting: Emergency Medicine

## 2016-12-21 ENCOUNTER — Emergency Department (HOSPITAL_COMMUNITY): Payer: Worker's Compensation

## 2016-12-21 ENCOUNTER — Emergency Department (HOSPITAL_COMMUNITY)
Admission: EM | Admit: 2016-12-21 | Discharge: 2016-12-21 | Disposition: A | Discharge status: Home / Self Care | Payer: Worker's Compensation | Attending: Emergency Medicine | Admitting: Emergency Medicine

## 2016-12-21 DIAGNOSIS — Z79899 Other long term (current) drug therapy: Secondary | ICD-10-CM | POA: Diagnosis not present

## 2016-12-21 DIAGNOSIS — Y939 Activity, unspecified: Secondary | ICD-10-CM | POA: Diagnosis not present

## 2016-12-21 DIAGNOSIS — Y999 Unspecified external cause status: Secondary | ICD-10-CM | POA: Insufficient documentation

## 2016-12-21 DIAGNOSIS — Y929 Unspecified place or not applicable: Secondary | ICD-10-CM | POA: Diagnosis not present

## 2016-12-21 DIAGNOSIS — F17290 Nicotine dependence, other tobacco product, uncomplicated: Secondary | ICD-10-CM | POA: Insufficient documentation

## 2016-12-21 DIAGNOSIS — S93401A Sprain of unspecified ligament of right ankle, initial encounter: Secondary | ICD-10-CM | POA: Diagnosis not present

## 2016-12-21 DIAGNOSIS — W010XXA Fall on same level from slipping, tripping and stumbling without subsequent striking against object, initial encounter: Secondary | ICD-10-CM | POA: Diagnosis not present

## 2016-12-21 DIAGNOSIS — S63502A Unspecified sprain of left wrist, initial encounter: Secondary | ICD-10-CM

## 2016-12-21 DIAGNOSIS — Z7984 Long term (current) use of oral hypoglycemic drugs: Secondary | ICD-10-CM | POA: Diagnosis not present

## 2016-12-21 DIAGNOSIS — S99911A Unspecified injury of right ankle, initial encounter: Secondary | ICD-10-CM | POA: Diagnosis present

## 2016-12-21 MED ORDER — NAPROXEN 500 MG PO TABS: 500.0000 mg | ORAL_TABLET | Freq: Two times a day (BID) | ORAL | 0 refills | Status: DC | PRN

## 2016-12-21 NOTE — ED Triage Notes (Signed)
Pt comes in after falling on ice while at work.  Pt works for [employer].  Complains of left wrist pain and right foot and ankle pain.  Ambulatory with slight limp. Denies hitting his head. No LOC.
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
## 2016-12-22 ENCOUNTER — Encounter: Payer: Self-pay | Admitting: *Deleted

## 2017-01-12 ENCOUNTER — Ambulatory Visit: Payer: Worker's Compensation | Admitting: Family Medicine

## 2017-01-13 ENCOUNTER — Ambulatory Visit: Payer: Worker's Compensation | Admitting: Family Medicine

## 2017-01-16 ENCOUNTER — Encounter (INDEPENDENT_AMBULATORY_CARE_PROVIDER_SITE_OTHER): Payer: Self-pay | Admitting: *Deleted

## 2017-01-16 VITALS — BP 149/97 | HR 76 | Temp 97.9°F | Wt 225.8 lb

## 2017-01-16 DIAGNOSIS — Z006 Encounter for examination for normal comparison and control in clinical research program: Secondary | ICD-10-CM

## 2017-01-16 NOTE — Progress Notes (Signed)
Study: A Phase 2b/3 Double Blind Safety and Efficacy Study of Injectable Cabotegravir compared to Daily Oral Tenofovir Disoproxil Fumarate/Emtricitabine (TDF/FTC), For Pre-Exposure Prophylaxis in HIV-Uninfected Cisgender Men and Transgender Women who have sex with Men.  Medication: Investigational Injectable Cabotegravir/placebo compared to Truvada/placebo. Duration: Around 4 years.  Douglas Bridges is here for week 33 visit. No new complaints or concerns. Seen in the ED in December for (L) wrist and (R) ankle injury after slipping on the ice. He was given prescription for naproxen which he states he never used and that injury/discomfort resolved within a few days and denies any problems at this time. He denies any new risky sexual contacts or symptoms suggestive of acute HIV. Rapid HIV non-reactive. Did state that he has had some difficulty in remembering to take his medications. Provided him a pill box and encouraged him to set alarms on his phone to assist with adherence. Cabotegravir/placebo injection given (R) glute. Site unremarkable. BP elevated this morning but states that he had not taken his BP medication today. States that he has an appointment with his PCP later this week for follow-up. Next study visit scheduled for 01/30/17 at 11:00am.

## 2017-01-17 LAB — AMYLASE: Amylase: 30 U/L (ref 21–101)

## 2017-01-17 LAB — C. TRACHOMATIS/N. GONORRHOEAE RNA
C. trachomatis RNA, TMA: NOT DETECTED
N. gonorrhoeae RNA, TMA: NOT DETECTED

## 2017-01-17 LAB — CK: CK TOTAL: 233 U/L — AB (ref 44–196)

## 2017-01-17 LAB — RPR: RPR Ser Ql: NONREACTIVE

## 2017-01-17 LAB — CBC WITH DIFFERENTIAL/PLATELET
BASOS ABS: 81 {cells}/uL (ref 0–200)
Basophils Relative: 1.4 %
EOS PCT: 1.6 %
Eosinophils Absolute: 93 cells/uL (ref 15–500)
HCT: 41.6 % (ref 38.5–50.0)
Hemoglobin: 14.1 g/dL (ref 13.2–17.1)
Lymphs Abs: 2361 cells/uL (ref 850–3900)
MCH: 29.3 pg (ref 27.0–33.0)
MCHC: 33.9 g/dL (ref 32.0–36.0)
MCV: 86.3 fL (ref 80.0–100.0)
MONOS PCT: 9.5 %
MPV: 9.9 fL (ref 7.5–12.5)
NEUTROS PCT: 46.8 %
Neutro Abs: 2714 cells/uL (ref 1500–7800)
PLATELETS: 438 10*3/uL — AB (ref 140–400)
RBC: 4.82 10*6/uL (ref 4.20–5.80)
RDW: 13.2 % (ref 11.0–15.0)
Total Lymphocyte: 40.7 %
WBC mixed population: 551 cells/uL (ref 200–950)
WBC: 5.8 10*3/uL (ref 3.8–10.8)

## 2017-01-17 LAB — COMPREHENSIVE METABOLIC PANEL
AG Ratio: 1.4 (calc) (ref 1.0–2.5)
ALT: 19 U/L (ref 9–46)
AST: 18 U/L (ref 10–40)
Albumin: 4.6 g/dL (ref 3.6–5.1)
Alkaline phosphatase (APISO): 65 U/L (ref 40–115)
BUN: 8 mg/dL (ref 7–25)
CALCIUM: 10.2 mg/dL (ref 8.6–10.3)
CO2: 30 mmol/L (ref 20–32)
CREATININE: 1.23 mg/dL (ref 0.60–1.35)
Chloride: 101 mmol/L (ref 98–110)
GLUCOSE: 91 mg/dL (ref 65–99)
Globulin: 3.3 g/dL (calc) (ref 1.9–3.7)
Potassium: 3.8 mmol/L (ref 3.5–5.3)
Sodium: 137 mmol/L (ref 135–146)
TOTAL PROTEIN: 7.9 g/dL (ref 6.1–8.1)
Total Bilirubin: 0.8 mg/dL (ref 0.2–1.2)

## 2017-01-17 LAB — HIV ANTIBODY (ROUTINE TESTING W REFLEX): HIV 1&2 Ab, 4th Generation: NONREACTIVE

## 2017-01-17 LAB — LIPASE: Lipase: 7 U/L (ref 7–60)

## 2017-01-17 LAB — PHOSPHORUS: PHOSPHORUS: 3.1 mg/dL (ref 2.5–4.5)

## 2017-01-19 ENCOUNTER — Ambulatory Visit: Payer: Worker's Compensation | Admitting: Family Medicine

## 2017-01-19 LAB — CT/NG RNA, TMA RECTAL
Chlamydia Trachomatis RNA: NOT DETECTED
Neisseria Gonorrhoeae RNA: NOT DETECTED

## 2017-01-30 ENCOUNTER — Encounter: Payer: Worker's Compensation | Admitting: *Deleted

## 2017-02-01 ENCOUNTER — Encounter (INDEPENDENT_AMBULATORY_CARE_PROVIDER_SITE_OTHER): Payer: Self-pay | Admitting: *Deleted

## 2017-02-01 VITALS — BP 148/97 | HR 80 | Temp 97.7°F | Wt 223.5 lb

## 2017-02-01 DIAGNOSIS — Z006 Encounter for examination for normal comparison and control in clinical research program: Secondary | ICD-10-CM

## 2017-02-01 NOTE — Progress Notes (Signed)
Study: A Phase 2b/3 Double Blind Safety and Efficacy Study of Injectable Cabotegravir compared to Daily Oral Tenofovir Disoproxil Fumarate/Emtricitabine (TDF/FTC), For Pre-Exposure Prophylaxis in HIV-Uninfected Cisgender Men and Transgender Women who have sex with Men.  Medication: Investigational Injectable Cabotegravir/placebo compared to Truvada/placebo. Duration: Around 4 years.  Douglas Bridges is here for week 35 visit. Denied any problems after his last injection. No new complaints or concerns. Rapid HIV non-reactive. He will return on 03/13/17 for his next visit.

## 2017-02-02 LAB — PHOSPHORUS: PHOSPHORUS: 2.7 mg/dL (ref 2.5–4.5)

## 2017-02-02 LAB — CBC WITH DIFFERENTIAL/PLATELET
BASOS ABS: 70 {cells}/uL (ref 0–200)
BASOS PCT: 1.1 %
EOS ABS: 58 {cells}/uL (ref 15–500)
Eosinophils Relative: 0.9 %
HCT: 41.9 % (ref 38.5–50.0)
Hemoglobin: 14 g/dL (ref 13.2–17.1)
Lymphs Abs: 2477 cells/uL (ref 850–3900)
MCH: 29 pg (ref 27.0–33.0)
MCHC: 33.4 g/dL (ref 32.0–36.0)
MCV: 86.9 fL (ref 80.0–100.0)
MPV: 10.2 fL (ref 7.5–12.5)
Monocytes Relative: 8.9 %
NEUTROS PCT: 50.4 %
Neutro Abs: 3226 cells/uL (ref 1500–7800)
Platelets: 406 10*3/uL — ABNORMAL HIGH (ref 140–400)
RBC: 4.82 10*6/uL (ref 4.20–5.80)
RDW: 12.7 % (ref 11.0–15.0)
Total Lymphocyte: 38.7 %
WBC: 6.4 10*3/uL (ref 3.8–10.8)
WBCMIX: 570 {cells}/uL (ref 200–950)

## 2017-02-02 LAB — COMPREHENSIVE METABOLIC PANEL
AG Ratio: 1.4 (calc) (ref 1.0–2.5)
ALKALINE PHOSPHATASE (APISO): 60 U/L (ref 40–115)
ALT: 11 U/L (ref 9–46)
AST: 16 U/L (ref 10–40)
Albumin: 4.5 g/dL (ref 3.6–5.1)
BILIRUBIN TOTAL: 0.5 mg/dL (ref 0.2–1.2)
BUN/Creatinine Ratio: 5 (calc) — ABNORMAL LOW (ref 6–22)
BUN: 6 mg/dL — AB (ref 7–25)
CALCIUM: 10.1 mg/dL (ref 8.6–10.3)
CO2: 28 mmol/L (ref 20–32)
Chloride: 102 mmol/L (ref 98–110)
Creat: 1.29 mg/dL (ref 0.60–1.35)
Globulin: 3.3 g/dL (calc) (ref 1.9–3.7)
Glucose, Bld: 89 mg/dL (ref 65–99)
Potassium: 3.6 mmol/L (ref 3.5–5.3)
Sodium: 140 mmol/L (ref 135–146)
Total Protein: 7.8 g/dL (ref 6.1–8.1)

## 2017-02-02 LAB — HIV ANTIBODY (ROUTINE TESTING W REFLEX): HIV 1&2 Ab, 4th Generation: NONREACTIVE

## 2017-02-02 LAB — AMYLASE: Amylase: 30 U/L (ref 21–101)

## 2017-02-02 LAB — CK: CK TOTAL: 236 U/L — AB (ref 44–196)

## 2017-02-02 LAB — LIPASE: Lipase: 9 U/L (ref 7–60)

## 2017-02-02 LAB — SPECIMEN COMPROMISED

## 2017-02-03 MED FILL — NORTRIPTYLINE HCL 25 MG CAP: 25 | 30 days supply | Qty: 30 | Fill #0

## 2017-02-06 ENCOUNTER — Ambulatory Visit: Payer: Self-pay | Admitting: Neurology

## 2017-02-13 ENCOUNTER — Ambulatory Visit: Payer: Self-pay | Admitting: Family Medicine

## 2017-03-13 ENCOUNTER — Encounter: Payer: Self-pay | Admitting: *Deleted

## 2017-03-21 ENCOUNTER — Telehealth: Payer: Self-pay

## 2017-03-21 NOTE — Telephone Encounter (Signed)
Called ppt to schedule their next study visit. Ppt did not answer; unable to leave a vm (mailbox full). Sent ppt a text message; awaiting reply.

## 2017-03-27 ENCOUNTER — Encounter (INDEPENDENT_AMBULATORY_CARE_PROVIDER_SITE_OTHER): Payer: Self-pay | Admitting: *Deleted

## 2017-03-27 VITALS — BP 146/96 | HR 74 | Temp 98.2°F | Wt 214.8 lb

## 2017-03-27 DIAGNOSIS — Z006 Encounter for examination for normal comparison and control in clinical research program: Secondary | ICD-10-CM

## 2017-03-27 NOTE — Progress Notes (Signed)
Study: A Phase 2b/3 Double Blind Safety and Efficacy Study of Injectable Cabotegravir compared to Daily Oral Tenofovir Disoproxil Fumarate/Emtricitabine (TDF/FTC), For Pre-Exposure Prophylaxis in HIV-Uninfected Cisgender Men and Transgender Women who have sex with Men.  Medication: Investigational Injectable Cabotegravir/placebo compared to Truvada/placebo. Duration: Around 4 years.  Douglas Bridges here for week 41 visit. No new complaints or concerns. Returned 7 pills and states that he did miss some doses but not sure how many. He states that he may have taken some doses out of an old bottle as well. Verbalized frustration regarding taking pills. States that he just "hates taking pills" including his other medication. Says he just forgets to take medication sometimes. We discussed ways to help with taking his medication daily including using pill box and setting alarm on phone. He also stated that he never started taking his metformin or his nortriptyline. Plans to follow-up with PCP. Rapid HIV non-reactive. Cabotegravir/placebo injection given (R) buttock. Site unremarkable. He will retunr next week for safety visit.

## 2017-03-28 LAB — COMPREHENSIVE METABOLIC PANEL
AG Ratio: 1.5 (calc) (ref 1.0–2.5)
ALBUMIN MSPROF: 4.6 g/dL (ref 3.6–5.1)
ALT: 13 U/L (ref 9–46)
AST: 14 U/L (ref 10–40)
Alkaline phosphatase (APISO): 58 U/L (ref 40–115)
BUN / CREAT RATIO: 5 (calc) — AB (ref 6–22)
BUN: 6 mg/dL — AB (ref 7–25)
CO2: 29 mmol/L (ref 20–32)
Calcium: 10.3 mg/dL (ref 8.6–10.3)
Chloride: 104 mmol/L (ref 98–110)
Creat: 1.1 mg/dL (ref 0.60–1.35)
GLUCOSE: 83 mg/dL (ref 65–99)
Globulin: 3.1 g/dL (calc) (ref 1.9–3.7)
POTASSIUM: 4 mmol/L (ref 3.5–5.3)
SODIUM: 140 mmol/L (ref 135–146)
Total Bilirubin: 0.3 mg/dL (ref 0.2–1.2)
Total Protein: 7.7 g/dL (ref 6.1–8.1)

## 2017-03-28 LAB — CBC WITH DIFFERENTIAL/PLATELET
BASOS PCT: 1.6 %
Basophils Absolute: 88 cells/uL (ref 0–200)
Eosinophils Absolute: 99 cells/uL (ref 15–500)
Eosinophils Relative: 1.8 %
HCT: 42.2 % (ref 38.5–50.0)
HEMOGLOBIN: 14 g/dL (ref 13.2–17.1)
Lymphs Abs: 2310 cells/uL (ref 850–3900)
MCH: 28.7 pg (ref 27.0–33.0)
MCHC: 33.2 g/dL (ref 32.0–36.0)
MCV: 86.7 fL (ref 80.0–100.0)
MONOS PCT: 9.2 %
MPV: 10.3 fL (ref 7.5–12.5)
NEUTROS ABS: 2497 {cells}/uL (ref 1500–7800)
Neutrophils Relative %: 45.4 %
PLATELETS: 366 10*3/uL (ref 140–400)
RBC: 4.87 10*6/uL (ref 4.20–5.80)
RDW: 11.9 % (ref 11.0–15.0)
TOTAL LYMPHOCYTE: 42 %
WBC mixed population: 506 cells/uL (ref 200–950)
WBC: 5.5 10*3/uL (ref 3.8–10.8)

## 2017-03-28 LAB — PHOSPHORUS: PHOSPHORUS: 3.1 mg/dL (ref 2.5–4.5)

## 2017-03-28 LAB — AMYLASE: Amylase: 35 U/L (ref 21–101)

## 2017-03-28 LAB — CK: CK TOTAL: 230 U/L — AB (ref 44–196)

## 2017-03-28 LAB — LIPASE: Lipase: 12 U/L (ref 7–60)

## 2017-03-28 LAB — HIV ANTIBODY (ROUTINE TESTING W REFLEX): HIV 1&2 Ab, 4th Generation: NONREACTIVE

## 2017-03-28 NOTE — Telephone Encounter (Signed)
Texted ppt to arrange appt time on Friday, 03/24/17. Ppt unable to visit clinic that day, and was rescheduled for 03/27/17. Ppt arrived on time for their study visit on 03/27/17.

## 2017-04-05 ENCOUNTER — Encounter: Payer: Self-pay | Admitting: *Deleted

## 2017-04-05 ENCOUNTER — Other Ambulatory Visit: Payer: Self-pay

## 2017-04-05 ENCOUNTER — Emergency Department (HOSPITAL_COMMUNITY)
Admission: EM | Admit: 2017-04-05 | Discharge: 2017-04-05 | Disposition: A | Discharge status: Home / Self Care | Payer: Self-pay | Attending: Emergency Medicine | Admitting: Emergency Medicine

## 2017-04-05 ENCOUNTER — Encounter (HOSPITAL_COMMUNITY): Payer: Self-pay | Admitting: Emergency Medicine

## 2017-04-05 DIAGNOSIS — F17290 Nicotine dependence, other tobacco product, uncomplicated: Secondary | ICD-10-CM | POA: Insufficient documentation

## 2017-04-05 DIAGNOSIS — Z79899 Other long term (current) drug therapy: Secondary | ICD-10-CM | POA: Insufficient documentation

## 2017-04-05 DIAGNOSIS — Z5321 Procedure and treatment not carried out due to patient leaving prior to being seen by health care provider: Secondary | ICD-10-CM | POA: Insufficient documentation

## 2017-04-05 DIAGNOSIS — J029 Acute pharyngitis, unspecified: Secondary | ICD-10-CM | POA: Insufficient documentation

## 2017-04-05 DIAGNOSIS — J020 Streptococcal pharyngitis: Secondary | ICD-10-CM | POA: Insufficient documentation

## 2017-04-05 DIAGNOSIS — Z7984 Long term (current) use of oral hypoglycemic drugs: Secondary | ICD-10-CM | POA: Insufficient documentation

## 2017-04-05 LAB — RAPID STREP SCREEN (MED CTR MEBANE ONLY): Streptococcus, Group A Screen (Direct): POSITIVE — AB

## 2017-04-05 MED ORDER — PENICILLIN G BENZATHINE 1200000 UNIT/2ML IM SUSP
1.2000 10*6.[IU] | Freq: Once | INTRAMUSCULAR | Status: AC
  Administered 2017-04-05: 1.2 10*6.[IU] via INTRAMUSCULAR
  Filled 2017-04-05: qty 2

## 2017-04-05 MED ORDER — IBUPROFEN 800 MG PO TABS
800.0000 mg | ORAL_TABLET | Freq: Once | ORAL | Status: AC
  Administered 2017-04-05: 800 mg via ORAL
  Filled 2017-04-05: qty 1

## 2017-04-05 MED ORDER — HYDROCODONE-ACETAMINOPHEN 7.5-325 MG/15ML PO SOLN: 10.0000 mL | Freq: Four times a day (QID) | ORAL | 0 refills | Status: AC | PRN

## 2017-04-05 MED ORDER — CHLORHEXIDINE GLUCONATE 0.12 % MT SOLN: 15.0000 mL | Freq: Two times a day (BID) | OROMUCOSAL | 0 refills | Status: DC

## 2017-04-05 NOTE — ED Triage Notes (Signed)
Pt complaint of flu like symptoms, body aches/right ear pain/sore throat. Left yesterday related to wait time.

## 2017-04-05 NOTE — ED Notes (Signed)
Pt not in lobby.  

## 2017-04-05 NOTE — ED Triage Notes (Signed)
Pt woke up yesterday with a red swollen throat
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
## 2017-04-05 NOTE — ED Notes (Signed)
No answer when called for recheck on vital signs 

## 2017-04-06 ENCOUNTER — Encounter (INDEPENDENT_AMBULATORY_CARE_PROVIDER_SITE_OTHER): Payer: Self-pay | Admitting: *Deleted

## 2017-04-06 VITALS — BP 139/82 | HR 76 | Temp 97.6°F | Wt 216.5 lb

## 2017-04-06 DIAGNOSIS — Z006 Encounter for examination for normal comparison and control in clinical research program: Secondary | ICD-10-CM

## 2017-04-06 NOTE — Progress Notes (Signed)
Study: A Phase 2b/3 Double Blind Safety and Efficacy Study of Injectable Cabotegravir compared to Daily Oral Tenofovir Disoproxil Fumarate/Emtricitabine (TDF/FTC), For Pre-Exposure Prophylaxis in HIV-Uninfected Cisgender Men and Transgender Women who have sex with Men.  Medication: Investigational Injectable Cabotegravir/placebo compared to Truvada/placebo. Duration: Around 4 years.  Douglas Bridges is here for week 43 visit. [facility] and treated with PCN. Continues to c/o sore throat, trouble swallowing, feeling flushed. Currently afebrile. Has an appointment with ENT later this morning. Denied any problem with his last study injection. Rapid HIV non-reactive. Next study visit scheduled for 05/08/17 at 10:30am.

## 2017-04-07 LAB — HIV ANTIBODY (ROUTINE TESTING W REFLEX): HIV: NONREACTIVE

## 2017-04-07 LAB — CBC WITH DIFFERENTIAL/PLATELET
BASOS PCT: 0.6 %
Basophils Absolute: 75 cells/uL (ref 0–200)
EOS PCT: 1 %
Eosinophils Absolute: 125 cells/uL (ref 15–500)
HCT: 40.1 % (ref 38.5–50.0)
HEMOGLOBIN: 13.9 g/dL (ref 13.2–17.1)
Lymphs Abs: 2475 cells/uL (ref 850–3900)
MCH: 29.4 pg (ref 27.0–33.0)
MCHC: 34.7 g/dL (ref 32.0–36.0)
MCV: 84.8 fL (ref 80.0–100.0)
MONOS PCT: 9.1 %
MPV: 10.4 fL (ref 7.5–12.5)
NEUTROS ABS: 8688 {cells}/uL — AB (ref 1500–7800)
Neutrophils Relative %: 69.5 %
PLATELETS: 331 10*3/uL (ref 140–400)
RBC: 4.73 10*6/uL (ref 4.20–5.80)
RDW: 12.2 % (ref 11.0–15.0)
TOTAL LYMPHOCYTE: 19.8 %
WBC mixed population: 1138 cells/uL — ABNORMAL HIGH (ref 200–950)
WBC: 12.5 10*3/uL — AB (ref 3.8–10.8)

## 2017-04-07 LAB — COMPREHENSIVE METABOLIC PANEL
AG RATIO: 1.3 (calc) (ref 1.0–2.5)
ALT: 10 U/L (ref 9–46)
AST: 17 U/L (ref 10–40)
Albumin: 4.2 g/dL (ref 3.6–5.1)
Alkaline phosphatase (APISO): 57 U/L (ref 40–115)
BUN: 11 mg/dL (ref 7–25)
CHLORIDE: 100 mmol/L (ref 98–110)
CO2: 29 mmol/L (ref 20–32)
CREATININE: 1.28 mg/dL (ref 0.60–1.35)
Calcium: 9.7 mg/dL (ref 8.6–10.3)
GLOBULIN: 3.2 g/dL (ref 1.9–3.7)
GLUCOSE: 90 mg/dL (ref 65–99)
Potassium: 3.7 mmol/L (ref 3.5–5.3)
SODIUM: 137 mmol/L (ref 135–146)
TOTAL PROTEIN: 7.4 g/dL (ref 6.1–8.1)
Total Bilirubin: 0.4 mg/dL (ref 0.2–1.2)

## 2017-04-07 LAB — CK: Total CK: 244 U/L — ABNORMAL HIGH (ref 44–196)

## 2017-04-07 LAB — AMYLASE: Amylase: 47 U/L (ref 21–101)

## 2017-04-07 LAB — LIPASE: LIPASE: 31 U/L (ref 7–60)

## 2017-04-07 LAB — PHOSPHORUS: PHOSPHORUS: 4 mg/dL (ref 2.5–4.5)

## 2017-05-05 ENCOUNTER — Ambulatory Visit: Payer: Self-pay

## 2017-05-05 DIAGNOSIS — M79609 Pain in unspecified limb: Secondary | ICD-10-CM

## 2017-05-05 DIAGNOSIS — R202 Paresthesia of skin: Principal | ICD-10-CM

## 2017-05-08 ENCOUNTER — Other Ambulatory Visit: Payer: Self-pay | Admitting: Family Medicine

## 2017-05-08 ENCOUNTER — Ambulatory Visit: Payer: Self-pay

## 2017-05-08 ENCOUNTER — Encounter (INDEPENDENT_AMBULATORY_CARE_PROVIDER_SITE_OTHER): Payer: Self-pay | Admitting: *Deleted

## 2017-05-08 VITALS — BP 145/96 | HR 69 | Temp 97.7°F | Wt 217.0 lb

## 2017-05-08 DIAGNOSIS — M79604 Pain in right leg: Secondary | ICD-10-CM

## 2017-05-08 DIAGNOSIS — Z006 Encounter for examination for normal comparison and control in clinical research program: Secondary | ICD-10-CM

## 2017-05-08 LAB — COMPREHENSIVE METABOLIC PANEL
AG RATIO: 1.4 (calc) (ref 1.0–2.5)
ALKALINE PHOSPHATASE (APISO): 61 U/L (ref 40–115)
ALT: 10 U/L (ref 9–46)
AST: 15 U/L (ref 10–40)
Albumin: 4.4 g/dL (ref 3.6–5.1)
BILIRUBIN TOTAL: 0.4 mg/dL (ref 0.2–1.2)
BUN: 7 mg/dL (ref 7–25)
CALCIUM: 9.7 mg/dL (ref 8.6–10.3)
CHLORIDE: 102 mmol/L (ref 98–110)
CO2: 31 mmol/L (ref 20–32)
Creat: 1.16 mg/dL (ref 0.60–1.35)
GLUCOSE: 91 mg/dL (ref 65–99)
Globulin: 3.1 g/dL (calc) (ref 1.9–3.7)
Potassium: 4 mmol/L (ref 3.5–5.3)
Sodium: 139 mmol/L (ref 135–146)
Total Protein: 7.5 g/dL (ref 6.1–8.1)

## 2017-05-08 LAB — CBC WITH DIFFERENTIAL/PLATELET
BASOS ABS: 78 {cells}/uL (ref 0–200)
Basophils Relative: 1.6 %
EOS ABS: 127 {cells}/uL (ref 15–500)
Eosinophils Relative: 2.6 %
HEMATOCRIT: 42.5 % (ref 38.5–50.0)
HEMOGLOBIN: 14.2 g/dL (ref 13.2–17.1)
Lymphs Abs: 2313 cells/uL (ref 850–3900)
MCH: 28.3 pg (ref 27.0–33.0)
MCHC: 33.4 g/dL (ref 32.0–36.0)
MCV: 84.8 fL (ref 80.0–100.0)
MONOS PCT: 11.3 %
MPV: 10.1 fL (ref 7.5–12.5)
NEUTROS ABS: 1828 {cells}/uL (ref 1500–7800)
Neutrophils Relative %: 37.3 %
Platelets: 364 10*3/uL (ref 140–400)
RBC: 5.01 10*6/uL (ref 4.20–5.80)
RDW: 12.7 % (ref 11.0–15.0)
Total Lymphocyte: 47.2 %
WBC: 4.9 10*3/uL (ref 3.8–10.8)
WBCMIX: 554 {cells}/uL (ref 200–950)

## 2017-05-08 LAB — LIPASE: LIPASE: 19 U/L (ref 7–60)

## 2017-05-08 LAB — PHOSPHORUS: Phosphorus: 3.2 mg/dL (ref 2.5–4.5)

## 2017-05-08 LAB — CK: Total CK: 210 U/L — ABNORMAL HIGH (ref 44–196)

## 2017-05-08 LAB — AMYLASE: AMYLASE: 38 U/L (ref 21–101)

## 2017-05-08 NOTE — Progress Notes (Signed)
Study: A Phase 2b/3 Double Blind Safety and Efficacy Study of Injectable Cabotegravir compared to Daily Oral Tenofovir Disoproxil Fumarate/Emtricitabine (TDF/FTC), For Pre-Exposure Prophylaxis in HIV-Uninfected Cisgender Men and Transgender Women who have sex with Men.  Medication: Investigational Injectable Cabotegravir/placebo compared to Truvada/placebo. Duration: Around 4 years.  Douglas Bridges is here for week 49 visit. States that he started getting a sore throat again yesterday. Bilateral tonsils enlarged without exudate. 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 Plans to schedule an appointment now that he has job and insurance. Did not bring his oral study meds today but states that he did miss 2 doses since his last injection visit. Feels that his adherence has improved since he started using the pill box. Rapid HIV non-reactive. Cabotegravir/placebo injection given (R) glute. Site unremarkable. He will return in 2 weeks for his next study visit.

## 2017-05-09 LAB — HIV ANTIBODY (ROUTINE TESTING W REFLEX): HIV: NONREACTIVE

## 2017-05-10 ENCOUNTER — Ambulatory Visit: Payer: Self-pay | Attending: Family Medicine | Admitting: Physician Assistant

## 2017-05-10 ENCOUNTER — Ambulatory Visit: Payer: Self-pay | Admitting: Nurse Practitioner

## 2017-05-10 VITALS — BP 154/118 | HR 90 | Temp 98.1°F | Resp 16 | Wt 217.0 lb

## 2017-05-10 DIAGNOSIS — S39012A Strain of muscle, fascia and tendon of lower back, initial encounter: Secondary | ICD-10-CM | POA: Insufficient documentation

## 2017-05-10 DIAGNOSIS — M5489 Other dorsalgia: Secondary | ICD-10-CM | POA: Insufficient documentation

## 2017-05-10 DIAGNOSIS — W109XXA Fall (on) (from) unspecified stairs and steps, initial encounter: Secondary | ICD-10-CM | POA: Insufficient documentation

## 2017-05-10 DIAGNOSIS — I10 Essential (primary) hypertension: Secondary | ICD-10-CM | POA: Insufficient documentation

## 2017-05-10 DIAGNOSIS — Z79899 Other long term (current) drug therapy: Secondary | ICD-10-CM | POA: Insufficient documentation

## 2017-05-10 DIAGNOSIS — M79604 Pain in right leg: Secondary | ICD-10-CM | POA: Insufficient documentation

## 2017-05-10 MED ORDER — NAPROXEN 500 MG PO TABS: 500.0000 mg | ORAL_TABLET | Freq: Two times a day (BID) | ORAL | 0 refills | Status: DC

## 2017-05-10 MED ORDER — METHOCARBAMOL 500 MG PO TABS: 1000.0000 mg | ORAL_TABLET | Freq: Three times a day (TID) | ORAL | 0 refills | Status: DC

## 2017-05-10 MED ORDER — AMLODIPINE BESYLATE 10 MG PO TABS: 10.0000 mg | ORAL_TABLET | Freq: Every day | ORAL | 3 refills | Status: DC
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
## 2017-05-10 NOTE — Patient Instructions (Signed)
Check your blood pressure 3 times/week and record and bring to your next visit.    Hypertension Hypertension is another name for high blood pressure. High blood pressure forces your heart to work harder to pump blood. This can cause problems over time. There are two numbers in a blood pressure reading. There is a top number (systolic) over a bottom number (diastolic). It is best to have a blood pressure below 120/80. Healthy choices can help lower your blood pressure. You may need medicine to help lower your blood pressure if:  Your blood pressure cannot be lowered with healthy choices.  Your blood pressure is higher than 130/80.  Follow these instructions at home: Eating and drinking  If directed, follow the DASH eating plan. This diet includes: ? Filling half of your plate at each meal with fruits and vegetables. ? Filling one quarter of your plate at each meal with whole grains. Whole grains include whole wheat pasta, brown rice, and whole grain bread. ? Eating or drinking low-fat dairy products, such as skim milk or low-fat yogurt. ? Filling one quarter of your plate at each meal with low-fat (lean) proteins. Low-fat proteins include fish, skinless chicken, eggs, beans, and tofu. ? Avoiding fatty meat, cured and processed meat, or chicken with skin. ? Avoiding premade or processed food.  Eat less than 1,500 mg of salt (sodium) a day.  Limit alcohol use to no more than 1 drink a day for nonpregnant women and 2 drinks a day for men. One drink equals 12 oz of beer, 5 oz of wine, or 1 oz of hard liquor. Lifestyle  Work with your doctor to stay at a healthy weight or to lose weight. Ask your doctor what the best weight is for you.  Get at least 30 minutes of exercise that causes your heart to beat faster (aerobic exercise) most days of the week. This may include walking, swimming, or biking.  Get at least 30 minutes of exercise that strengthens your muscles (resistance exercise) at  least 3 days a week. This may include lifting weights or pilates.  Do not use any products that contain nicotine or tobacco. This includes cigarettes and e-cigarettes. If you need help quitting, ask your doctor.  Check your blood pressure at home as told by your doctor.  Keep all follow-up visits as told by your doctor. This is important. Medicines  Take over-the-counter and prescription medicines only as told by your doctor. Follow directions carefully.  Do not skip doses of blood pressure medicine. The medicine does not work as well if you skip doses. Skipping doses also puts you at risk for problems.  Ask your doctor about side effects or reactions to medicines that you should watch for. Contact a doctor if:  You think you are having a reaction to the medicine you are taking.  You have headaches that keep coming back (recurring).  You feel dizzy.  You have swelling in your ankles.  You have trouble with your vision. Get help right away if:  You get a very bad headache.  You start to feel confused.  You feel weak or numb.  You feel faint.  You get very bad pain in your: ? Chest. ? Belly (abdomen).  You throw up (vomit) more than once.  You have trouble breathing. Summary  Hypertension is another name for high blood pressure.  Making healthy choices can help lower blood pressure. If your blood pressure cannot be controlled with healthy choices, you may need to  take medicine. This information is not intended to replace advice given to you by your health care provider. Make sure you discuss any questions you have with your health care provider. Document Released: 06/15/2007 Document Revised: 11/25/2015 Document Reviewed: 11/25/2015 Elsevier Interactive Patient Education  [employer].

## 2017-05-11 ENCOUNTER — Encounter (HOSPITAL_COMMUNITY): Payer: Self-pay | Admitting: Emergency Medicine

## 2017-05-11 ENCOUNTER — Other Ambulatory Visit: Payer: Self-pay

## 2017-05-11 ENCOUNTER — Emergency Department (HOSPITAL_COMMUNITY): Payer: Self-pay

## 2017-05-11 ENCOUNTER — Emergency Department (HOSPITAL_COMMUNITY)
Admission: EM | Admit: 2017-05-11 | Discharge: 2017-05-11 | Disposition: A | Discharge status: Home / Self Care | Payer: Self-pay | Attending: Emergency Medicine | Admitting: Emergency Medicine

## 2017-05-11 DIAGNOSIS — F17210 Nicotine dependence, cigarettes, uncomplicated: Secondary | ICD-10-CM | POA: Insufficient documentation

## 2017-05-11 DIAGNOSIS — Y929 Unspecified place or not applicable: Secondary | ICD-10-CM | POA: Insufficient documentation

## 2017-05-11 DIAGNOSIS — Y939 Activity, unspecified: Secondary | ICD-10-CM | POA: Insufficient documentation

## 2017-05-11 DIAGNOSIS — S39012A Strain of muscle, fascia and tendon of lower back, initial encounter: Secondary | ICD-10-CM | POA: Insufficient documentation

## 2017-05-11 DIAGNOSIS — Y999 Unspecified external cause status: Secondary | ICD-10-CM | POA: Insufficient documentation

## 2017-05-11 DIAGNOSIS — W108XXA Fall (on) (from) other stairs and steps, initial encounter: Secondary | ICD-10-CM | POA: Insufficient documentation

## 2017-05-11 MED ORDER — AMLODIPINE BESYLATE 5 MG PO TABS
10.0000 mg | ORAL_TABLET | Freq: Once | ORAL | Status: AC
  Administered 2017-05-11: 10 mg via ORAL
  Filled 2017-05-11: qty 2

## 2017-05-11 NOTE — ED Triage Notes (Signed)
Pt presents with lower back pain post fall 4/24.
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
## 2017-05-11 NOTE — Discharge Instructions (Signed)
The x-ray of your lower back done today was negative for acute abnormality. Take the medications that were prescribed to you by your primary care provider yesterday to help with your symptoms.  Applying a heating pad, stretching and massaging the area may also help.

## 2017-05-12 ENCOUNTER — Encounter: Payer: Self-pay | Admitting: *Deleted

## 2017-05-16 ENCOUNTER — Other Ambulatory Visit: Payer: Self-pay | Admitting: Family Medicine

## 2017-05-16 DIAGNOSIS — M79609 Pain in unspecified limb: Secondary | ICD-10-CM

## 2017-05-16 DIAGNOSIS — R202 Paresthesia of skin: Principal | ICD-10-CM

## 2017-05-22 ENCOUNTER — Encounter (INDEPENDENT_AMBULATORY_CARE_PROVIDER_SITE_OTHER): Payer: Self-pay

## 2017-05-22 VITALS — BP 131/86 | HR 80 | Temp 98.1°F | Wt 220.0 lb

## 2017-05-22 DIAGNOSIS — Z006 Encounter for examination for normal comparison and control in clinical research program: Secondary | ICD-10-CM

## 2017-05-22 NOTE — Progress Notes (Signed)
Patient attended routine follow up study visit. No issues noted or s/s of any illnesses. Voiced no complaints. Next injection visit scheduled for 07/03/17.

## 2017-05-23 LAB — COMPREHENSIVE METABOLIC PANEL
AG Ratio: 1.5 (calc) (ref 1.0–2.5)
ALT: 13 U/L (ref 9–46)
AST: 18 U/L (ref 10–40)
Albumin: 4.3 g/dL (ref 3.6–5.1)
Alkaline phosphatase (APISO): 57 U/L (ref 40–115)
BILIRUBIN TOTAL: 0.4 mg/dL (ref 0.2–1.2)
BUN: 7 mg/dL (ref 7–25)
CALCIUM: 9.8 mg/dL (ref 8.6–10.3)
CO2: 30 mmol/L (ref 20–32)
Chloride: 104 mmol/L (ref 98–110)
Creat: 1.27 mg/dL (ref 0.60–1.35)
GLUCOSE: 95 mg/dL (ref 65–99)
Globulin: 2.8 g/dL (calc) (ref 1.9–3.7)
Potassium: 3.8 mmol/L (ref 3.5–5.3)
SODIUM: 140 mmol/L (ref 135–146)
TOTAL PROTEIN: 7.1 g/dL (ref 6.1–8.1)

## 2017-05-23 LAB — CBC WITH DIFFERENTIAL/PLATELET
BASOS ABS: 117 {cells}/uL (ref 0–200)
Basophils Relative: 1.5 %
Eosinophils Absolute: 187 cells/uL (ref 15–500)
Eosinophils Relative: 2.4 %
HEMATOCRIT: 38 % — AB (ref 38.5–50.0)
Hemoglobin: 13.1 g/dL — ABNORMAL LOW (ref 13.2–17.1)
Lymphs Abs: 3432 cells/uL (ref 850–3900)
MCH: 29 pg (ref 27.0–33.0)
MCHC: 34.5 g/dL (ref 32.0–36.0)
MCV: 84.3 fL (ref 80.0–100.0)
MONOS PCT: 9.8 %
MPV: 10.2 fL (ref 7.5–12.5)
NEUTROS PCT: 42.3 %
Neutro Abs: 3299 cells/uL (ref 1500–7800)
Platelets: 366 10*3/uL (ref 140–400)
RBC: 4.51 10*6/uL (ref 4.20–5.80)
RDW: 13 % (ref 11.0–15.0)
TOTAL LYMPHOCYTE: 44 %
WBC mixed population: 764 cells/uL (ref 200–950)
WBC: 7.8 10*3/uL (ref 3.8–10.8)

## 2017-05-23 LAB — AMYLASE: Amylase: 33 U/L (ref 21–101)

## 2017-05-23 LAB — HIV ANTIBODY (ROUTINE TESTING W REFLEX): HIV 1&2 Ab, 4th Generation: NONREACTIVE

## 2017-05-23 LAB — LIPASE: Lipase: 17 U/L (ref 7–60)

## 2017-05-23 LAB — PHOSPHORUS: PHOSPHORUS: 4.4 mg/dL (ref 2.5–4.5)

## 2017-05-23 LAB — CK: CK TOTAL: 486 U/L — AB (ref 44–196)

## 2017-06-02 ENCOUNTER — Ambulatory Visit: Payer: Self-pay | Admitting: Family Medicine

## 2017-07-03 ENCOUNTER — Encounter (INDEPENDENT_AMBULATORY_CARE_PROVIDER_SITE_OTHER): Payer: Self-pay | Admitting: *Deleted

## 2017-07-03 VITALS — BP 127/91 | HR 73 | Temp 97.6°F | Wt 228.5 lb

## 2017-07-03 DIAGNOSIS — Z006 Encounter for examination for normal comparison and control in clinical research program: Secondary | ICD-10-CM

## 2017-07-03 NOTE — Progress Notes (Signed)
Douglas Bridges is here for his week 57 HPTN visit. No new complaints or concerns. Returned #59 oral study medication making his adherence 55%. Feels that he only missed a couple of doses and that he may have taken some doses out of an old bottle. Instructed him to return any old bottles and to start taking meds from the new bottles from today. Denies any symptoms of acute HIV. Rapid HIV non-reactive. Cabotegravir/placebo injection given (R) buttock. Site unremarkable. He will return in 2 weeks for his next study visit.

## 2017-07-04 LAB — CBC WITH DIFFERENTIAL/PLATELET
BASOS PCT: 1.4 %
Basophils Absolute: 87 cells/uL (ref 0–200)
Eosinophils Absolute: 167 cells/uL (ref 15–500)
Eosinophils Relative: 2.7 %
HEMATOCRIT: 41.5 % (ref 38.5–50.0)
HEMOGLOBIN: 13.8 g/dL (ref 13.2–17.1)
LYMPHS ABS: 2592 {cells}/uL (ref 850–3900)
MCH: 28.5 pg (ref 27.0–33.0)
MCHC: 33.3 g/dL (ref 32.0–36.0)
MCV: 85.6 fL (ref 80.0–100.0)
MPV: 10.5 fL (ref 7.5–12.5)
Monocytes Relative: 9.5 %
NEUTROS ABS: 2765 {cells}/uL (ref 1500–7800)
Neutrophils Relative %: 44.6 %
PLATELETS: 325 10*3/uL (ref 140–400)
RBC: 4.85 10*6/uL (ref 4.20–5.80)
RDW: 13.7 % (ref 11.0–15.0)
TOTAL LYMPHOCYTE: 41.8 %
WBC: 6.2 10*3/uL (ref 3.8–10.8)
WBCMIX: 589 {cells}/uL (ref 200–950)

## 2017-07-04 LAB — URINALYSIS, ROUTINE W REFLEX MICROSCOPIC
BACTERIA UA: NONE SEEN /HPF
BILIRUBIN URINE: NEGATIVE
Glucose, UA: NEGATIVE
Hgb urine dipstick: NEGATIVE
Hyaline Cast: NONE SEEN /LPF
Ketones, ur: NEGATIVE
LEUKOCYTES UA: NEGATIVE
Nitrite: NEGATIVE
PH: 6.5 (ref 5.0–8.0)
SPECIFIC GRAVITY, URINE: 1.015 (ref 1.001–1.03)

## 2017-07-04 LAB — COMPREHENSIVE METABOLIC PANEL
AG Ratio: 1.5 (calc) (ref 1.0–2.5)
ALBUMIN MSPROF: 4.5 g/dL (ref 3.6–5.1)
ALT: 14 U/L (ref 9–46)
AST: 16 U/L (ref 10–40)
Alkaline phosphatase (APISO): 60 U/L (ref 40–115)
BUN: 8 mg/dL (ref 7–25)
CHLORIDE: 103 mmol/L (ref 98–110)
CO2: 25 mmol/L (ref 20–32)
CREATININE: 1.08 mg/dL (ref 0.60–1.35)
Calcium: 9.8 mg/dL (ref 8.6–10.3)
GLOBULIN: 3.1 g/dL (ref 1.9–3.7)
Glucose, Bld: 104 mg/dL — ABNORMAL HIGH (ref 65–99)
POTASSIUM: 3.9 mmol/L (ref 3.5–5.3)
Sodium: 139 mmol/L (ref 135–146)
Total Bilirubin: 0.3 mg/dL (ref 0.2–1.2)
Total Protein: 7.6 g/dL (ref 6.1–8.1)

## 2017-07-04 LAB — LIPID PANEL
CHOL/HDL RATIO: 4.9 (calc) (ref <5.0)
CHOLESTEROL: 188 mg/dL (ref <200)
HDL: 38 mg/dL — AB (ref >40)
LDL CHOLESTEROL (CALC): 126 mg/dL — AB
Non-HDL Cholesterol (Calc): 150 mg/dL (calc) — ABNORMAL HIGH (ref <130)
Triglycerides: 125 mg/dL (ref <150)

## 2017-07-04 LAB — CK: Total CK: 354 U/L — ABNORMAL HIGH (ref 44–196)

## 2017-07-04 LAB — HIV ANTIBODY (ROUTINE TESTING W REFLEX): HIV 1&2 Ab, 4th Generation: NONREACTIVE

## 2017-07-04 LAB — LIPASE: Lipase: 15 U/L (ref 7–60)

## 2017-07-04 LAB — HEPATITIS C ANTIBODY
HEP C AB: NONREACTIVE
SIGNAL TO CUT-OFF: 0.02 (ref <1.00)

## 2017-07-04 LAB — PHOSPHORUS: Phosphorus: 4 mg/dL (ref 2.5–4.5)

## 2017-07-04 LAB — C. TRACHOMATIS/N. GONORRHOEAE RNA
C. trachomatis RNA, TMA: NOT DETECTED
N. gonorrhoeae RNA, TMA: NOT DETECTED

## 2017-07-04 LAB — RPR: RPR: NONREACTIVE

## 2017-07-04 LAB — AMYLASE: AMYLASE: 41 U/L (ref 21–101)

## 2017-07-05 LAB — CT/NG RNA, TMA RECTAL
Chlamydia Trachomatis RNA: NOT DETECTED
Neisseria Gonorrhoeae RNA: NOT DETECTED

## 2017-07-17 ENCOUNTER — Encounter (INDEPENDENT_AMBULATORY_CARE_PROVIDER_SITE_OTHER): Payer: Self-pay

## 2017-07-17 VITALS — BP 150/114 | HR 74 | Temp 98.0°F | Wt 233.0 lb

## 2017-07-17 DIAGNOSIS — Z006 Encounter for examination for normal comparison and control in clinical research program: Secondary | ICD-10-CM

## 2017-07-17 NOTE — Progress Notes (Signed)
Participant here for [id number] study safety visit. Voices no complaints other than mild pain at injection site from last visit, lasting only the day of. Blood pressure is high today. Patient states his blood pressure is always high and he has an upcoming doctor's visit with his PCP for follow-up. States his physician has increased his amlodopine dose to 10mg  from 5. He acknowledges he does not always recall to take his medications. He has started setting an alarm since last visit and states that this has worked very well to remind him to take his study med and BP medications. Counseled briefly on lifestyle changes to improve BP as well as need for follow through with PCP recommendations. Participant is scheduled for next visit on 8/19.

## 2017-07-18 LAB — CBC WITH DIFFERENTIAL/PLATELET
BASOS PCT: 1.4 %
Basophils Absolute: 81 cells/uL (ref 0–200)
EOS ABS: 139 {cells}/uL (ref 15–500)
Eosinophils Relative: 2.4 %
HCT: 39.6 % (ref 38.5–50.0)
Hemoglobin: 13.4 g/dL (ref 13.2–17.1)
Lymphs Abs: 2633 cells/uL (ref 850–3900)
MCH: 28.5 pg (ref 27.0–33.0)
MCHC: 33.8 g/dL (ref 32.0–36.0)
MCV: 84.3 fL (ref 80.0–100.0)
MONOS PCT: 9.5 %
MPV: 10.2 fL (ref 7.5–12.5)
NEUTROS ABS: 2395 {cells}/uL (ref 1500–7800)
Neutrophils Relative %: 41.3 %
PLATELETS: 345 10*3/uL (ref 140–400)
RBC: 4.7 10*6/uL (ref 4.20–5.80)
RDW: 13.5 % (ref 11.0–15.0)
TOTAL LYMPHOCYTE: 45.4 %
WBC mixed population: 551 cells/uL (ref 200–950)
WBC: 5.8 10*3/uL (ref 3.8–10.8)

## 2017-07-18 LAB — COMPREHENSIVE METABOLIC PANEL
AG Ratio: 1.5 (calc) (ref 1.0–2.5)
ALBUMIN MSPROF: 4.5 g/dL (ref 3.6–5.1)
ALKALINE PHOSPHATASE (APISO): 61 U/L (ref 40–115)
ALT: 13 U/L (ref 9–46)
AST: 16 U/L (ref 10–40)
BILIRUBIN TOTAL: 0.4 mg/dL (ref 0.2–1.2)
BUN: 7 mg/dL (ref 7–25)
CALCIUM: 9.6 mg/dL (ref 8.6–10.3)
CHLORIDE: 103 mmol/L (ref 98–110)
CO2: 24 mmol/L (ref 20–32)
CREATININE: 1.08 mg/dL (ref 0.60–1.35)
GLOBULIN: 3.1 g/dL (ref 1.9–3.7)
Glucose, Bld: 85 mg/dL (ref 65–99)
POTASSIUM: 4 mmol/L (ref 3.5–5.3)
Sodium: 139 mmol/L (ref 135–146)
Total Protein: 7.6 g/dL (ref 6.1–8.1)

## 2017-07-18 LAB — LIPASE: Lipase: 11 U/L (ref 7–60)

## 2017-07-18 LAB — PHOSPHORUS: Phosphorus: 3.8 mg/dL (ref 2.5–4.5)

## 2017-07-18 LAB — HIV ANTIBODY (ROUTINE TESTING W REFLEX): HIV 1&2 Ab, 4th Generation: NONREACTIVE

## 2017-07-18 LAB — AMYLASE: Amylase: 36 U/L (ref 21–101)

## 2017-07-18 LAB — CK: CK TOTAL: 296 U/L — AB (ref 44–196)

## 2017-07-18 LAB — SPECIMEN COMPROMISED

## 2017-08-18 ENCOUNTER — Ambulatory Visit: Payer: Self-pay | Attending: Nurse Practitioner | Admitting: Nurse Practitioner

## 2017-08-18 ENCOUNTER — Encounter: Payer: Self-pay | Admitting: Nurse Practitioner

## 2017-08-18 VITALS — BP 143/94 | HR 86 | Temp 98.0°F | Ht 74.0 in | Wt 236.0 lb

## 2017-08-18 DIAGNOSIS — Z8249 Family history of ischemic heart disease and other diseases of the circulatory system: Secondary | ICD-10-CM | POA: Insufficient documentation

## 2017-08-18 DIAGNOSIS — E559 Vitamin D deficiency, unspecified: Secondary | ICD-10-CM

## 2017-08-18 DIAGNOSIS — R7303 Prediabetes: Secondary | ICD-10-CM

## 2017-08-18 DIAGNOSIS — I10 Essential (primary) hypertension: Secondary | ICD-10-CM

## 2017-08-18 DIAGNOSIS — G43709 Chronic migraine without aura, not intractable, without status migrainosus: Secondary | ICD-10-CM

## 2017-08-18 DIAGNOSIS — Z79899 Other long term (current) drug therapy: Secondary | ICD-10-CM | POA: Insufficient documentation

## 2017-08-18 LAB — POCT GLYCOSYLATED HEMOGLOBIN (HGB A1C): Hemoglobin A1C: 5.4 % (ref 4.0–5.6)

## 2017-08-18 LAB — GLUCOSE, POCT (MANUAL RESULT ENTRY): POC Glucose: 95 mg/dl (ref 70–99)

## 2017-08-18 MED ORDER — AMLODIPINE BESYLATE 10 MG PO TABS: 10.0000 mg | ORAL_TABLET | Freq: Every day | ORAL | 3 refills | Status: DC

## 2017-08-18 MED ORDER — IBUPROFEN 800 MG PO TABS: 800.0000 mg | ORAL_TABLET | Freq: Three times a day (TID) | ORAL | 0 refills | Status: DC | PRN

## 2017-08-18 MED ORDER — HYDROCHLOROTHIAZIDE 25 MG PO TABS: 25.0000 mg | ORAL_TABLET | Freq: Every day | ORAL | 1 refills | Status: DC
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
## 2017-08-18 NOTE — Patient Instructions (Signed)
Preventing Type 2 Diabetes Mellitus Type 2 diabetes (type 2 diabetes mellitus) is a long-term (chronic) disease that affects blood sugar (glucose) levels. Normally, a hormone called insulin allows glucose to enter cells in the body. The cells use glucose for energy. In type 2 diabetes, one or both of these problems may be present:  The body does not make enough insulin.  The body does not respond properly to insulin that it makes (insulin resistance).  Insulin resistance or lack of insulin causes excess glucose to build up in the blood instead of going into cells. As a result, high blood glucose (hyperglycemia) develops, which can cause many complications. Being overweight or obese and having an inactive (sedentary) lifestyle can increase your risk for diabetes. Type 2 diabetes can be delayed or prevented by making certain nutrition and lifestyle changes. What nutrition changes can be made?  Eat healthy meals and snacks regularly. Keep a healthy snack with you for when you get hungry between meals, such as fruit or a handful of nuts.  Eat lean meats and proteins that are low in saturated fats, such as chicken, fish, egg whites, and beans. Avoid processed meats.  Eat plenty of fruits and vegetables and plenty of grains that have not been processed (whole grains). It is recommended that you eat: ? 1?2 cups of fruit every day. ? 2?3 cups of vegetables every day. ? 6?8 oz of whole grains every day, such as oats, whole wheat, bulgur, brown rice, quinoa, and millet.  Eat low-fat dairy products, such as milk, yogurt, and cheese.  Eat foods that contain healthy fats, such as nuts, avocado, olive oil, and canola oil.  Drink water throughout the day. Avoid drinks that contain added sugar, such as soda or sweet tea.  Follow instructions from your health care provider about specific eating or drinking restrictions.  Control how much food you eat at a time (portion size). ? Check food labels to find  out the serving sizes of foods. ? Use a kitchen scale to weigh amounts of foods.  Saute or steam food instead of frying it. Cook with water or broth instead of oils or butter.  Limit your intake of: ? Salt (sodium). Have no more than 1 tsp (2,400 mg) of sodium a day. If you have heart disease or high blood pressure, have less than ? tsp (1,500 mg) of sodium a day. ? Saturated fat. This is fat that is solid at room temperature, such as butter or fat on meat. What lifestyle changes can be made?  Activity  Do moderate-intensity physical activity for at least 30 minutes on at least 5 days of the week, or as much as told by your health care provider.  Ask your health care provider what activities are safe for you. A mix of physical activities may be best, such as walking, swimming, cycling, and strength training.  Try to add physical activity into your day. For example: ? Park in spots that are farther away than usual, so that you walk more. For example, park in a far corner of the parking lot when you go to the office or the grocery store. ? Take a walk during your lunch break. ? Use stairs instead of elevators or escalators. Weight Loss  Lose weight as directed. Your health care provider can determine how much weight loss is best for you and can help you lose weight safely.  If you are overweight or obese, you may be instructed to lose at least 5?7 %   of your body weight. Alcohol and Tobacco   Limit alcohol intake to no more than 1 drink a day for nonpregnant women and 2 drinks a day for men. One drink equals 12 oz of beer, 5 oz of wine, or 1 oz of hard liquor.  Do not use any tobacco products, such as cigarettes, chewing tobacco, and e-cigarettes. If you need help quitting, ask your health care provider. Work With Your Health Care Provider  Have your blood glucose tested regularly, as told by your health care provider.  Discuss your risk factors and how you can reduce your risk for  diabetes.  Get screening tests as told by your health care provider. You may have screening tests regularly, especially if you have certain risk factors for type 2 diabetes.  Make an appointment with a diet and nutrition specialist (registered dietitian). A registered dietitian can help you make a healthy eating plan and can help you understand portion sizes and food labels. Why are these changes important?  It is possible to prevent or delay type 2 diabetes and related health problems by making lifestyle and nutrition changes.  It can be difficult to recognize signs of type 2 diabetes. The best way to avoid possible damage to your body is to take actions to prevent the disease before you develop symptoms. What can happen if changes are not made?  Your blood glucose levels may keep increasing. Having high blood glucose for a long time is dangerous. Too much glucose in your blood can damage your blood vessels, heart, kidneys, nerves, and eyes.  You may develop prediabetes or type 2 diabetes. Type 2 diabetes can lead to many chronic health problems and complications, such as: ? Heart disease. ? Stroke. ? Blindness. ? Kidney disease. ? Depression. ? Poor circulation in the feet and legs, which could lead to surgical removal (amputation) in severe cases. Where to find support:  Ask your health care provider to recommend a registered dietitian, diabetes educator, or weight loss program.  Look for local or online weight loss groups.  Join a gym, fitness club, or outdoor activity group, such as a walking club. Where to find more information: To learn more about diabetes and diabetes prevention, visit:  American Diabetes Association (ADA): www.diabetes.org  National Institute of Diabetes and Digestive and Kidney Diseases: www.niddk.nih.gov/health-information/diabetes  To learn more about healthy eating, visit:  The U.S. Department of Agriculture (USDA), Choose My Plate:  www.choosemyplate.gov/food-groups  Office of Disease Prevention and Health Promotion (ODPHP), Dietary Guidelines: www.health.gov/dietaryguidelines  Summary  You can reduce your risk for type 2 diabetes by increasing your physical activity, eating healthy foods, and losing weight as directed.  Talk with your health care provider about your risk for type 2 diabetes. Ask about any blood tests or screening tests that you need to have. This information is not intended to replace advice given to you by your health care provider. Make sure you discuss any questions you have with your health care provider. Document Released: 04/20/2015 Document Revised: 06/04/2015 Document Reviewed: 02/17/2015 Elsevier Interactive Patient Education  2018 Elsevier Inc. Prediabetes Eating Plan Prediabetes-also called impaired glucose tolerance or impaired fasting glucose-is a condition that causes blood sugar (blood glucose) levels to be higher than normal. Following a healthy diet can help to keep prediabetes under control. It can also help to lower the risk of type 2 diabetes and heart disease, which are increased in people who have prediabetes. Along with regular exercise, a healthy diet:  Promotes weight loss.    Helps to control blood sugar levels.  Helps to improve the way that the body uses insulin.  What do I need to know about this eating plan?  Use the glycemic index (GI) to plan your meals. The index tells you how quickly a food will raise your blood sugar. Choose low-GI foods. These foods take a longer time to raise blood sugar.  Pay close attention to the amount of carbohydrates in the food that you eat. Carbohydrates increase blood sugar levels.  Keep track of how many calories you take in. Eating the right amount of calories will help you to achieve a healthy weight. Losing about 7 percent of your starting weight can help to prevent type 2 diabetes.  You may want to follow a Mediterranean diet. This  diet includes a lot of vegetables, lean meats or fish, whole grains, fruits, and healthy oils and fats. What foods can I eat? Grains Whole grains, such as whole-wheat or whole-grain breads, crackers, cereals, and pasta. Unsweetened oatmeal. Bulgur. Barley. Quinoa. Brown rice. Corn or whole-wheat flour tortillas or taco shells. Vegetables Lettuce. Spinach. Peas. Beets. Cauliflower. Cabbage. Broccoli. Carrots. Tomatoes. Squash. Eggplant. Herbs. Peppers. Onions. Cucumbers. Brussels sprouts. Fruits Berries. Bananas. Apples. Oranges. Grapes. Papaya. Mango. Pomegranate. Kiwi. Grapefruit. Cherries. Meats and Other Protein Sources Seafood. Lean meats, such as chicken and turkey or lean cuts of pork and beef. Tofu. Eggs. Nuts. Beans. Dairy Low-fat or fat-free dairy products, such as yogurt, cottage cheese, and cheese. Beverages Water. Tea. Coffee. Sugar-free or diet soda. Seltzer water. Milk. Milk alternatives, such as soy or almond milk. Condiments Mustard. Relish. Low-fat, low-sugar ketchup. Low-fat, low-sugar barbecue sauce. Low-fat or fat-free mayonnaise. Sweets and Desserts Sugar-free or low-fat pudding. Sugar-free or low-fat ice cream and other frozen treats. Fats and Oils Avocado. Walnuts. Olive oil. The items listed above may not be a complete list of recommended foods or beverages. Contact your dietitian for more options. What foods are not recommended? Grains Refined white flour and flour products, such as bread, pasta, snack foods, and cereals. Beverages Sweetened drinks, such as sweet iced tea and soda. Sweets and Desserts Baked goods, such as cake, cupcakes, pastries, cookies, and cheesecake. The items listed above may not be a complete list of foods and beverages to avoid. Contact your dietitian for more information. This information is not intended to replace advice given to you by your health care provider. Make sure you discuss any questions you have with your health care  provider. Document Released: 05/13/2014 Document Revised: 06/04/2015 Document Reviewed: 01/22/2014 Elsevier Interactive Patient Education  2017 Elsevier Inc.  

## 2017-08-19 LAB — VITAMIN D 25 HYDROXY (VIT D DEFICIENCY, FRACTURES): VIT D 25 HYDROXY: 10.3 ng/mL — AB (ref 30.0–100.0)

## 2017-08-24 ENCOUNTER — Other Ambulatory Visit: Payer: Self-pay | Admitting: Nurse Practitioner

## 2017-08-24 MED ORDER — VITAMIN D (ERGOCALCIFEROL) 1.25 MG (50000 UNIT) PO CAPS: 50000.0000 [IU] | ORAL_CAPSULE | ORAL | 1 refills | Status: DC

## 2017-08-28 ENCOUNTER — Encounter (INDEPENDENT_AMBULATORY_CARE_PROVIDER_SITE_OTHER): Payer: Self-pay | Admitting: *Deleted

## 2017-08-28 VITALS — BP 149/104 | HR 77 | Temp 98.1°F | Wt 238.0 lb

## 2017-08-28 DIAGNOSIS — Z006 Encounter for examination for normal comparison and control in clinical research program: Secondary | ICD-10-CM

## 2017-08-28 NOTE — Progress Notes (Signed)
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 He says he is tired all the time and doesn't know if it's related to vit D deficiency or possibly sleep apnea. He is supposed to be taking vit d 50,000 units weekly but hasn't gotten that filled yet. He says he has his BP meds but has trouble remembering to take it. BP today was 149/104. We discussed adherence strategies for his BP meds and study meds. His partner who lives with him will start reminding him daily. An injection of study product was given without problem. He will return in 1 week for followup.

## 2017-08-29 LAB — CBC WITH DIFFERENTIAL/PLATELET
BASOS ABS: 101 {cells}/uL (ref 0–200)
Basophils Relative: 1.6 %
EOS ABS: 189 {cells}/uL (ref 15–500)
Eosinophils Relative: 3 %
HCT: 42.2 % (ref 38.5–50.0)
Hemoglobin: 14 g/dL (ref 13.2–17.1)
Lymphs Abs: 3112 cells/uL (ref 850–3900)
MCH: 28.6 pg (ref 27.0–33.0)
MCHC: 33.2 g/dL (ref 32.0–36.0)
MCV: 86.3 fL (ref 80.0–100.0)
MPV: 10.4 fL (ref 7.5–12.5)
Monocytes Relative: 9.6 %
NEUTROS PCT: 36.4 %
Neutro Abs: 2293 cells/uL (ref 1500–7800)
PLATELETS: 380 10*3/uL (ref 140–400)
RBC: 4.89 10*6/uL (ref 4.20–5.80)
RDW: 13 % (ref 11.0–15.0)
TOTAL LYMPHOCYTE: 49.4 %
WBC mixed population: 605 cells/uL (ref 200–950)
WBC: 6.3 10*3/uL (ref 3.8–10.8)

## 2017-08-29 LAB — COMPREHENSIVE METABOLIC PANEL
AG RATIO: 1.5 (calc) (ref 1.0–2.5)
ALKALINE PHOSPHATASE (APISO): 57 U/L (ref 40–115)
ALT: 17 U/L (ref 9–46)
AST: 21 U/L (ref 10–40)
Albumin: 4.5 g/dL (ref 3.6–5.1)
BILIRUBIN TOTAL: 0.4 mg/dL (ref 0.2–1.2)
BUN: 8 mg/dL (ref 7–25)
CALCIUM: 10 mg/dL (ref 8.6–10.3)
CO2: 28 mmol/L (ref 20–32)
Chloride: 102 mmol/L (ref 98–110)
Creat: 1.18 mg/dL (ref 0.60–1.35)
Globulin: 3 g/dL (calc) (ref 1.9–3.7)
Glucose, Bld: 102 mg/dL — ABNORMAL HIGH (ref 65–99)
Potassium: 4 mmol/L (ref 3.5–5.3)
SODIUM: 139 mmol/L (ref 135–146)
Total Protein: 7.5 g/dL (ref 6.1–8.1)

## 2017-08-29 LAB — CK: Total CK: 494 U/L — ABNORMAL HIGH (ref 44–196)

## 2017-08-29 LAB — LIPASE: LIPASE: 12 U/L (ref 7–60)

## 2017-08-29 LAB — AMYLASE: AMYLASE: 37 U/L (ref 21–101)

## 2017-08-29 LAB — PHOSPHORUS: Phosphorus: 4.2 mg/dL (ref 2.5–4.5)

## 2017-08-29 LAB — HIV ANTIBODY (ROUTINE TESTING W REFLEX): HIV: NONREACTIVE

## 2017-09-04 ENCOUNTER — Encounter (INDEPENDENT_AMBULATORY_CARE_PROVIDER_SITE_OTHER): Payer: Self-pay

## 2017-09-04 VITALS — BP 137/92 | HR 69 | Temp 97.9°F | Wt 238.0 lb

## 2017-09-04 DIAGNOSIS — Z006 Encounter for examination for normal comparison and control in clinical research program: Secondary | ICD-10-CM

## 2017-09-04 NOTE — Progress Notes (Signed)
Participant here for [id number] study week 67. No complaints voiced concerning last injection. He states he is working on adherence with oral study IP. Sets an alarm but still sometimes forgets. Does not like to use the pill planner, provided a keychain pill holder so that he can keep a few on hand and take it when he remembers. Rapid HIV non-reactive. He is still complaining of fatigue, states he will be seeing PCP soon to discuss his vitamin D levels. He is feeling well otherwise and BP is improved this visit after amlodipine was increased from 5 to 10mg  daily. He is scheduled for next study visit on 10/14.

## 2017-09-05 ENCOUNTER — Telehealth: Payer: Self-pay

## 2017-09-05 LAB — PHOSPHORUS: PHOSPHORUS: 3.9 mg/dL (ref 2.5–4.5)

## 2017-09-05 LAB — CBC WITH DIFFERENTIAL/PLATELET
Basophils Absolute: 60 cells/uL (ref 0–200)
Basophils Relative: 1 %
EOS PCT: 3.2 %
Eosinophils Absolute: 192 cells/uL (ref 15–500)
HEMATOCRIT: 41 % (ref 38.5–50.0)
Hemoglobin: 13.8 g/dL (ref 13.2–17.1)
LYMPHS ABS: 3138 {cells}/uL (ref 850–3900)
MCH: 28.7 pg (ref 27.0–33.0)
MCHC: 33.7 g/dL (ref 32.0–36.0)
MCV: 85.2 fL (ref 80.0–100.0)
MONOS PCT: 9.7 %
MPV: 10.6 fL (ref 7.5–12.5)
NEUTROS PCT: 33.8 %
Neutro Abs: 2028 cells/uL (ref 1500–7800)
PLATELETS: 393 10*3/uL (ref 140–400)
RBC: 4.81 10*6/uL (ref 4.20–5.80)
RDW: 12.7 % (ref 11.0–15.0)
TOTAL LYMPHOCYTE: 52.3 %
WBC mixed population: 582 cells/uL (ref 200–950)
WBC: 6 10*3/uL (ref 3.8–10.8)

## 2017-09-05 LAB — COMPREHENSIVE METABOLIC PANEL
AG Ratio: 1.3 (calc) (ref 1.0–2.5)
ALT: 24 U/L (ref 9–46)
AST: 41 U/L — ABNORMAL HIGH (ref 10–40)
Albumin: 4.3 g/dL (ref 3.6–5.1)
Alkaline phosphatase (APISO): 56 U/L (ref 40–115)
BUN: 10 mg/dL (ref 7–25)
CALCIUM: 10.1 mg/dL (ref 8.6–10.3)
CO2: 31 mmol/L (ref 20–32)
Chloride: 103 mmol/L (ref 98–110)
Creat: 1.32 mg/dL (ref 0.60–1.35)
GLUCOSE: 96 mg/dL (ref 65–99)
Globulin: 3.2 g/dL (calc) (ref 1.9–3.7)
Potassium: 4.3 mmol/L (ref 3.5–5.3)
SODIUM: 140 mmol/L (ref 135–146)
TOTAL PROTEIN: 7.5 g/dL (ref 6.1–8.1)
Total Bilirubin: 0.4 mg/dL (ref 0.2–1.2)

## 2017-09-05 LAB — HIV ANTIBODY (ROUTINE TESTING W REFLEX): HIV 1&2 Ab, 4th Generation: NONREACTIVE

## 2017-09-05 LAB — AMYLASE: Amylase: 38 U/L (ref 21–101)

## 2017-09-05 LAB — LIPASE: Lipase: 11 U/L (ref 7–60)

## 2017-09-05 LAB — CK: CK TOTAL: 1605 U/L — AB (ref 44–196)

## 2017-09-05 NOTE — Telephone Encounter (Signed)
Called patient to reschedule his 09/15/17 appt w/ Franky MachoLuke for a BP recheck, Franky MachoLuke will be at a prior engagement and needs to schedule him for a different time.

## 2017-09-15 ENCOUNTER — Encounter: Payer: Self-pay | Admitting: Pharmacist

## 2017-09-19 ENCOUNTER — Emergency Department (HOSPITAL_COMMUNITY)
Admission: EM | Admit: 2017-09-19 | Discharge: 2017-09-19 | Disposition: A | Discharge status: Home / Self Care | Payer: Self-pay | Attending: Emergency Medicine | Admitting: Emergency Medicine

## 2017-09-19 ENCOUNTER — Encounter (HOSPITAL_COMMUNITY): Payer: Self-pay | Admitting: *Deleted

## 2017-09-19 ENCOUNTER — Other Ambulatory Visit: Payer: Self-pay

## 2017-09-19 DIAGNOSIS — I10 Essential (primary) hypertension: Secondary | ICD-10-CM | POA: Insufficient documentation

## 2017-09-19 DIAGNOSIS — Z79899 Other long term (current) drug therapy: Secondary | ICD-10-CM | POA: Insufficient documentation

## 2017-09-19 DIAGNOSIS — F17290 Nicotine dependence, other tobacco product, uncomplicated: Secondary | ICD-10-CM | POA: Insufficient documentation

## 2017-09-19 DIAGNOSIS — J039 Acute tonsillitis, unspecified: Secondary | ICD-10-CM | POA: Insufficient documentation

## 2017-09-19 LAB — CBC WITH DIFFERENTIAL/PLATELET
Basophils Absolute: 0 10*3/uL (ref 0.0–0.1)
Basophils Relative: 0 %
Eosinophils Absolute: 0 10*3/uL (ref 0.0–0.7)
Eosinophils Relative: 0 %
HEMATOCRIT: 40.9 % (ref 39.0–52.0)
Hemoglobin: 13.6 g/dL (ref 13.0–17.0)
LYMPHS ABS: 0.9 10*3/uL (ref 0.7–4.0)
LYMPHS PCT: 14 %
MCH: 28.8 pg (ref 26.0–34.0)
MCHC: 33.3 g/dL (ref 30.0–36.0)
MCV: 86.5 fL (ref 78.0–100.0)
MONO ABS: 0.7 10*3/uL (ref 0.1–1.0)
Monocytes Relative: 11 %
NEUTROS ABS: 4.7 10*3/uL (ref 1.7–7.7)
Neutrophils Relative %: 75 %
Platelets: 307 10*3/uL (ref 150–400)
RBC: 4.73 MIL/uL (ref 4.22–5.81)
RDW: 12.8 % (ref 11.5–15.5)
WBC: 6.3 10*3/uL (ref 4.0–10.5)

## 2017-09-19 LAB — BASIC METABOLIC PANEL
ANION GAP: 12 (ref 5–15)
BUN: 12 mg/dL (ref 6–20)
CHLORIDE: 100 mmol/L (ref 98–111)
CO2: 26 mmol/L (ref 22–32)
Calcium: 9.2 mg/dL (ref 8.9–10.3)
Creatinine, Ser: 1.41 mg/dL — ABNORMAL HIGH (ref 0.61–1.24)
GFR calc Af Amer: >60 mL/min (ref >60)
GFR calc non Af Amer: >60 mL/min (ref >60)
GLUCOSE: 109 mg/dL — AB (ref 70–99)
Potassium: 3.2 mmol/L — ABNORMAL LOW (ref 3.5–5.1)
Sodium: 138 mmol/L (ref 135–145)

## 2017-09-19 LAB — MONONUCLEOSIS SCREEN: Mono Screen: NEGATIVE

## 2017-09-19 LAB — GROUP A STREP BY PCR: Group A Strep by PCR: NOT DETECTED

## 2017-09-19 MED ORDER — CEPHALEXIN 500 MG PO CAPS: 500.0000 mg | ORAL_CAPSULE | Freq: Four times a day (QID) | ORAL | 0 refills | Status: DC

## 2017-09-19 MED ORDER — PREDNISONE 10 MG PO TABS: 20.0000 mg | ORAL_TABLET | Freq: Two times a day (BID) | ORAL | 0 refills | Status: DC

## 2017-09-19 MED ORDER — IBUPROFEN 800 MG PO TABS
800.0000 mg | ORAL_TABLET | Freq: Once | ORAL | Status: AC
  Administered 2017-09-19: 800 mg via ORAL
  Filled 2017-09-19: qty 1

## 2017-09-19 MED ORDER — SODIUM CHLORIDE 0.9 % IV BOLUS
1000.0000 mL | Freq: Once | INTRAVENOUS | Status: AC
  Administered 2017-09-19: 1000 mL via INTRAVENOUS

## 2017-09-19 NOTE — ED Notes (Signed)
ED Provider at bedside. 

## 2017-09-19 NOTE — ED Triage Notes (Signed)
Pt arrives with c/o note feeling well for several days. He has had a headache, cough, nasal congestion, chills. Pt with tachypnea and c/o finger tips tinglings, encourage deep breathing, respirations improved. Lung sounds are clear.  Last took ibuprofen this afternoon.

## 2017-09-19 NOTE — Discharge Instructions (Addendum)
Keflex and prednisone as prescribed.  Ibuprofen 600 mg every 6 hours as needed for pain.  Follow-up with your primary doctor if symptoms are not improving in the next 3 to 4 days, and return to the ER if symptoms significantly worsen or change.
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
## 2017-09-20 ENCOUNTER — Ambulatory Visit: Payer: Self-pay | Attending: Nurse Practitioner | Admitting: Nurse Practitioner

## 2017-09-20 ENCOUNTER — Encounter: Payer: Self-pay | Admitting: Nurse Practitioner

## 2017-09-20 VITALS — BP 121/86 | HR 97 | Temp 99.2°F | Ht 74.0 in | Wt 232.8 lb

## 2017-09-20 DIAGNOSIS — R7303 Prediabetes: Secondary | ICD-10-CM | POA: Insufficient documentation

## 2017-09-20 DIAGNOSIS — Z8249 Family history of ischemic heart disease and other diseases of the circulatory system: Secondary | ICD-10-CM | POA: Insufficient documentation

## 2017-09-20 DIAGNOSIS — J069 Acute upper respiratory infection, unspecified: Secondary | ICD-10-CM | POA: Insufficient documentation

## 2017-09-20 DIAGNOSIS — I10 Essential (primary) hypertension: Secondary | ICD-10-CM | POA: Insufficient documentation

## 2017-09-20 DIAGNOSIS — Z7952 Long term (current) use of systemic steroids: Secondary | ICD-10-CM | POA: Insufficient documentation

## 2017-09-20 DIAGNOSIS — Z79899 Other long term (current) drug therapy: Secondary | ICD-10-CM | POA: Insufficient documentation
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
## 2017-09-20 NOTE — Patient Instructions (Signed)
Upper Respiratory Infection, Adult Most upper respiratory infections (URIs) are a viral infection of the air passages leading to the lungs. A URI affects the nose, throat, and upper air passages. The most common type of URI is nasopharyngitis and is typically referred to as "the common cold." URIs run their course and usually go away on their own. Most of the time, a URI does not require medical attention, but sometimes a bacterial infection in the upper airways can follow a viral infection. This is called a secondary infection. Sinus and middle ear infections are common types of secondary upper respiratory infections. Bacterial pneumonia can also complicate a URI. A URI can worsen asthma and chronic obstructive pulmonary disease (COPD). Sometimes, these complications can require emergency medical care and may be life threatening. What are the causes? Almost all URIs are caused by viruses. A virus is a type of germ and can spread from one person to another. What increases the risk? You may be at risk for a URI if:  You smoke.  You have chronic heart or lung disease.  You have a weakened defense (immune) system.  You are very young or very old.  You have nasal allergies or asthma.  You work in crowded or poorly ventilated areas.  You work in health care facilities or schools.  What are the signs or symptoms? Symptoms typically develop 2-3 days after you come in contact with a cold virus. Most viral URIs last 7-10 days. However, viral URIs from the influenza virus (flu virus) can last 14-18 days and are typically more severe. Symptoms may include:  Runny or stuffy (congested) nose.  Sneezing.  Cough.  Sore throat.  Headache.  Fatigue.  Fever.  Loss of appetite.  Pain in your forehead, behind your eyes, and over your cheekbones (sinus pain).  Muscle aches.  How is this diagnosed? Your health care provider may diagnose a URI by:  Physical exam.  Tests to check that your  symptoms are not due to another condition such as: ? Strep throat. ? Sinusitis. ? Pneumonia. ? Asthma.  How is this treated? A URI goes away on its own with time. It cannot be cured with medicines, but medicines may be prescribed or recommended to relieve symptoms. Medicines may help:  Reduce your fever.  Reduce your cough.  Relieve nasal congestion.  Follow these instructions at home:  Take medicines only as directed by your health care provider.  Gargle warm saltwater or take cough drops to comfort your throat as directed by your health care provider.  Use a warm mist humidifier or inhale steam from a shower to increase air moisture. This may make it easier to breathe.  Drink enough fluid to keep your urine clear or pale yellow.  Eat soups and other clear broths and maintain good nutrition.  Rest as needed.  Return to work when your temperature has returned to normal or as your health care provider advises. You may need to stay home longer to avoid infecting others. You can also use a face mask and careful hand washing to prevent spread of the virus.  Increase the usage of your inhaler if you have asthma.  Do not use any tobacco products, including cigarettes, chewing tobacco, or electronic cigarettes. If you need help quitting, ask your health care provider. How is this prevented? The best way to protect yourself from getting a cold is to practice good hygiene.  Avoid oral or hand contact with people with cold symptoms.  Wash your   hands often if contact occurs.  There is no clear evidence that vitamin C, vitamin E, echinacea, or exercise reduces the chance of developing a cold. However, it is always recommended to get plenty of rest, exercise, and practice good nutrition. Contact a health care provider if:  You are getting worse rather than better.  Your symptoms are not controlled by medicine.  You have chills.  You have worsening shortness of breath.  You have  brown or red mucus.  You have yellow or brown nasal discharge.  You have pain in your face, especially when you bend forward.  You have a fever.  You have swollen neck glands.  You have pain while swallowing.  You have white areas in the back of your throat. Get help right away if:  You have severe or persistent: ? Headache. ? Ear pain. ? Sinus pain. ? Chest pain.  You have chronic lung disease and any of the following: ? Wheezing. ? Prolonged cough. ? Coughing up blood. ? A change in your usual mucus.  You have a stiff neck.  You have changes in your: ? Vision. ? Hearing. ? Thinking. ? Mood. This information is not intended to replace advice given to you by your health care provider. Make sure you discuss any questions you have with your health care provider. Document Released: 06/22/2000 Document Revised: 08/30/2015 Document Reviewed: 04/03/2013 Elsevier Interactive Patient Education  2018 [employer]  

## 2017-09-21 ENCOUNTER — Encounter: Payer: Self-pay | Admitting: Nurse Practitioner

## 2017-09-21 LAB — PLEASE NOTE:

## 2017-09-21 LAB — INFLUENZA A AND B
Influenza A Ag, EIA: NEGATIVE
Influenza B Ag, EIA: NEGATIVE

## 2017-10-23 VITALS — BP 123/86 | HR 79 | Temp 97.9°F | Wt 231.0 lb

## 2017-10-23 DIAGNOSIS — Z006 Encounter for examination for normal comparison and control in clinical research program: Secondary | ICD-10-CM

## 2017-10-23 NOTE — Research (Signed)
Participant here for [id number] study visit. He voiced no concerns. He states he has been more complaint with medications including BP mediation. Today's reading stable. He denies any viral illnesses or encounters with anyone other than his regular partner in the past 6 weeks. He stated he did have an episode of diarrhea a few weeks ago. Today's rapid HIV was reactive x2 tests. He was informed by research nursing staff of the news. An additional HIV-1 RNA was added for expeditious results and patient is aware we will contact him when the results arrive and that he will need to come back this week for follow-up care. He responded with shock and disassociation. He stated that he would be telling his partner about it after they left the office. He lives with his partner and reiterated that the one partner is all he had been with in over 6 weeks. Participant was offered counseling but declined supportive services. He is aware that he should call us with any needs prior to his next scheduled visit. Judeth Cornfield NP was consulted and she will accommodate an add on visit later this week in her clinic for rapid start treatment.

## 2017-10-25 LAB — CBC WITH DIFFERENTIAL/PLATELET
Basophils Absolute: 58 cells/uL (ref 0–200)
Basophils Relative: 1.1 %
EOS PCT: 3 %
Eosinophils Absolute: 159 cells/uL (ref 15–500)
HCT: 38.4 % — ABNORMAL LOW (ref 38.5–50.0)
HEMOGLOBIN: 13.1 g/dL — AB (ref 13.2–17.1)
Lymphs Abs: 2194 cells/uL (ref 850–3900)
MCH: 28.7 pg (ref 27.0–33.0)
MCHC: 34.1 g/dL (ref 32.0–36.0)
MCV: 84.2 fL (ref 80.0–100.0)
MONOS PCT: 12.4 %
MPV: 10.2 fL (ref 7.5–12.5)
NEUTROS ABS: 2231 {cells}/uL (ref 1500–7800)
NEUTROS PCT: 42.1 %
Platelets: 319 10*3/uL (ref 140–400)
RBC: 4.56 10*6/uL (ref 4.20–5.80)
RDW: 12.7 % (ref 11.0–15.0)
Total Lymphocyte: 41.4 %
WBC mixed population: 657 cells/uL (ref 200–950)
WBC: 5.3 10*3/uL (ref 3.8–10.8)

## 2017-10-25 LAB — COMPREHENSIVE METABOLIC PANEL
AG Ratio: 1.2 (calc) (ref 1.0–2.5)
ALT: 14 U/L (ref 9–46)
AST: 19 U/L (ref 10–40)
Albumin: 4.1 g/dL (ref 3.6–5.1)
Alkaline phosphatase (APISO): 59 U/L (ref 40–115)
BUN / CREAT RATIO: 5 (calc) — AB (ref 6–22)
BUN: 6 mg/dL — AB (ref 7–25)
CHLORIDE: 105 mmol/L (ref 98–110)
CO2: 29 mmol/L (ref 20–32)
CREATININE: 1.14 mg/dL (ref 0.60–1.35)
Calcium: 9.4 mg/dL (ref 8.6–10.3)
GLUCOSE: 100 mg/dL — AB (ref 65–99)
Globulin: 3.3 g/dL (calc) (ref 1.9–3.7)
POTASSIUM: 3.6 mmol/L (ref 3.5–5.3)
Sodium: 141 mmol/L (ref 135–146)
TOTAL PROTEIN: 7.4 g/dL (ref 6.1–8.1)
Total Bilirubin: 0.5 mg/dL (ref 0.2–1.2)

## 2017-10-25 LAB — HIV-1 RNA QUANT-NO REFLEX-BLD
HIV 1 RNA QUANT: 220000 {copies}/mL — AB
HIV-1 RNA QUANT, LOG: 5.34 {Log_copies}/mL — AB

## 2017-10-25 LAB — HIV-1/2 AB - DIFFERENTIATION
HIV 1 ANTIBODY: POSITIVE — AB
HIV 2 AB: NEGATIVE

## 2017-10-25 LAB — CK: Total CK: 370 U/L — ABNORMAL HIGH (ref 44–196)

## 2017-10-25 LAB — HIV ANTIBODY (ROUTINE TESTING W REFLEX): HIV: REACTIVE — AB

## 2017-10-25 LAB — PHOSPHORUS: Phosphorus: 4 mg/dL (ref 2.5–4.5)

## 2017-10-25 LAB — AMYLASE: AMYLASE: 37 U/L (ref 21–101)

## 2017-10-25 LAB — LIPASE: Lipase: 14 U/L (ref 7–60)

## 2017-10-26 ENCOUNTER — Ambulatory Visit: Payer: Self-pay | Admitting: Infectious Diseases

## 2017-10-26 VITALS — BP 155/96 | HR 82 | Temp 98.7°F | Wt 231.5 lb

## 2017-10-26 DIAGNOSIS — B20 Human immunodeficiency virus [HIV] disease: Secondary | ICD-10-CM

## 2017-10-26 DIAGNOSIS — Z006 Encounter for examination for normal comparison and control in clinical research program: Secondary | ICD-10-CM

## 2017-10-27 NOTE — Research (Signed)
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 Patient repeated lab testing per protocol requirements. Today he presented with his partner. We discussed all questions they had including routes of transmission, symptoms and lack thereof with infection, as well as ensuring to use condoms during intercourse during this time. The partner was tested and also presented positive via ora-quick rapid. We discussed emotional support as well as availability of counseling services on site. Counseling was declined. They both seem to currently have a strong support system in each other and I commended them on this, both actively participated in asking questions. A 30 day supply of Symtuza was provided to [place] via Cassie the pharmacist. It was stressed to both patients to not share medications, the partner would have an upcoming visit with a provider to prescribe what would be appropriate for him. It was stressed to [place] again that he should return all unused IP from the study and to ensure to not take it, as instructed during his last visit. Today he met with Juliann Pulse to complete [place] [place] and ADAP paperwork. Participant has an upcoming new patient visit on 11/13/17 with Mauricio Po, NP.

## 2017-10-30 ENCOUNTER — Encounter: Payer: Self-pay | Admitting: Family

## 2017-10-31 LAB — HIV-1 RNA QUANT-NO REFLEX-BLD
HIV 1 RNA QUANT: 138000 {copies}/mL — AB
HIV-1 RNA QUANT, LOG: 5.14 {Log_copies}/mL — AB

## 2017-10-31 LAB — HIV-1/2 AB - DIFFERENTIATION
HIV 2 AB: NEGATIVE
HIV-1 antibody: POSITIVE — AB

## 2017-10-31 LAB — HIV ANTIBODY (ROUTINE TESTING W REFLEX): HIV 1&2 Ab, 4th Generation: REACTIVE — AB

## 2017-11-01 LAB — CBC
CD4 ABSOLUTE: 638 /uL
CD4%: 30.4 % — ABNORMAL LOW
CD8 T CELL SUPPRESSOR: 28.1 %
CD8 T Cell Abs: 590 /uL

## 2017-11-13 ENCOUNTER — Ambulatory Visit (INDEPENDENT_AMBULATORY_CARE_PROVIDER_SITE_OTHER): Payer: Self-pay | Admitting: Family

## 2017-11-13 ENCOUNTER — Encounter: Payer: Self-pay | Admitting: Family

## 2017-11-13 VITALS — BP 135/95 | HR 92 | Temp 98.6°F | Wt 231.0 lb

## 2017-11-13 DIAGNOSIS — Z23 Encounter for immunization: Secondary | ICD-10-CM

## 2017-11-13 DIAGNOSIS — B20 Human immunodeficiency virus [HIV] disease: Secondary | ICD-10-CM

## 2017-11-13 MED ORDER — BICTEGRAVIR-EMTRICITAB-TENOFOV 50-200-25 MG PO TABS: 1.0000 | ORAL_TABLET | Freq: Every day | ORAL | 2 refills | Status: DC
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
## 2017-11-13 NOTE — Patient Instructions (Signed)
Nice to meet you.  We will change your medication to [facility].  Start taking your medication tomorrow.   Plan for follow up in 1 month or sooner if needed. We will check your blood work during this office visit.

## 2017-11-14 ENCOUNTER — Encounter: Payer: Self-pay | Admitting: Family

## 2017-11-14 NOTE — Assessment & Plan Note (Signed)
Douglas Bridges has newly diagnosed HIV disease with an initial viral load of 138,000 and CD4 count of 638. He was a participant in the [id number] study prior to. Discussed HIV basics, transmission, risk of progression if untreated, prevention, and medication. He has no signs/symptoms of opportunistic infections at present. [place] and HMAP have been approved. Influenza and Prevnar updated today. Will start him on Biktarvy. Has met with pharmacy staff with questions answered. Plan for office follow up in 1 month or sooner if needed.

## 2017-12-06 ENCOUNTER — Encounter: Payer: Self-pay | Admitting: Family

## 2017-12-06 ENCOUNTER — Ambulatory Visit (INDEPENDENT_AMBULATORY_CARE_PROVIDER_SITE_OTHER): Payer: Self-pay | Admitting: Family

## 2017-12-06 VITALS — BP 159/96 | HR 79 | Temp 98.0°F | Wt 234.0 lb

## 2017-12-06 DIAGNOSIS — B20 Human immunodeficiency virus [HIV] disease: Secondary | ICD-10-CM

## 2017-12-06 DIAGNOSIS — Z23 Encounter for immunization: Secondary | ICD-10-CM

## 2017-12-06 DIAGNOSIS — Z0000 Encounter for general adult medical examination without abnormal findings: Secondary | ICD-10-CM

## 2017-12-06 DIAGNOSIS — I10 Essential (primary) hypertension: Secondary | ICD-10-CM | POA: Insufficient documentation

## 2017-12-06 MED ORDER — HYDROCHLOROTHIAZIDE 25 MG PO TABS: 25.0000 mg | ORAL_TABLET | Freq: Every day | ORAL | 1 refills | Status: DC

## 2017-12-06 MED ORDER — AMLODIPINE BESYLATE 10 MG PO TABS: 10.0000 mg | ORAL_TABLET | Freq: Every day | ORAL | 1 refills | Status: DC

## 2017-12-06 NOTE — Patient Instructions (Addendum)
Nice to see you.  Please continue to take your Fer[place] ComunidadBiktarvy as prescribed.   We will check your blood work today.   I will send your blood pressure medications to [facility] on [address].  We will get you set up for a dental appointment.  Follow up office visit in 6 weeks or sooner if needed with lab work 1-2 weeks prior to your appointment.

## 2017-12-06 NOTE — Assessment & Plan Note (Signed)
   Menveo updated today.   Dental screening form completed for referral to [facility] dental clinic.

## 2017-12-06 NOTE — Assessment & Plan Note (Signed)
Blood pressure is elevated today as Mr. Douglas Bridges has been out of his hydrochlorothiazide for several days although continues to take his amlodipine as prescribed. No adverse side effects or hypotensive readings. Encouraged to let providers know if he is running out of medication and monitor blood pressures at home as able. No signs/symptoms of end organ damage or headaches at present. Continue current dose of hydrochlorothiazide and amlodipine.

## 2017-12-06 NOTE — Assessment & Plan Note (Signed)
Mr. Douglas Bridges appears to be doing very well with his HIV regimen of Biktarvy with no missed doses or adverse side effects. He has no signs/symptoms of opportunistic infection or progressive HIV disease at present. Declines condoms. Check blood work today. Follow up office visit in 6 weeks or sooner if needed with lab work 1-2 weeks prior to appointment.
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
## 2017-12-08 LAB — T-HELPER CELL (CD4) - (RCID CLINIC ONLY)
CD4 T CELL ABS: 780 /uL (ref 400–2700)
CD4 T CELL HELPER: 39 % (ref 33–55)

## 2017-12-12 ENCOUNTER — Encounter: Payer: Self-pay | Admitting: Infectious Diseases

## 2017-12-12 LAB — COMPREHENSIVE METABOLIC PANEL
AG Ratio: 1.3 (calc) (ref 1.0–2.5)
ALT: 10 U/L (ref 9–46)
AST: 17 U/L (ref 10–40)
Albumin: 4.2 g/dL (ref 3.6–5.1)
Alkaline phosphatase (APISO): 51 U/L (ref 40–115)
BUN / CREAT RATIO: 5 (calc) — AB (ref 6–22)
BUN: 6 mg/dL — AB (ref 7–25)
CO2: 30 mmol/L (ref 20–32)
CREATININE: 1.19 mg/dL (ref 0.60–1.35)
Calcium: 9.9 mg/dL (ref 8.6–10.3)
Chloride: 101 mmol/L (ref 98–110)
GLUCOSE: 97 mg/dL (ref 65–99)
Globulin: 3.2 g/dL (calc) (ref 1.9–3.7)
Potassium: 3.8 mmol/L (ref 3.5–5.3)
Sodium: 137 mmol/L (ref 135–146)
TOTAL PROTEIN: 7.4 g/dL (ref 6.1–8.1)
Total Bilirubin: 0.4 mg/dL (ref 0.2–1.2)

## 2017-12-12 LAB — HIV-1 RNA QUANT-NO REFLEX-BLD
HIV 1 RNA Quant: 27 copies/mL — ABNORMAL HIGH
HIV-1 RNA QUANT, LOG: 1.43 {Log_copies}/mL — AB

## 2018-01-12 ENCOUNTER — Emergency Department (HOSPITAL_COMMUNITY): Admission: EM | Admit: 2018-01-12 | Discharge: 2018-01-12 | Discharge status: Against Medical Advice | Payer: Self-pay

## 2018-01-13 ENCOUNTER — Encounter (HOSPITAL_COMMUNITY): Payer: Self-pay | Admitting: Emergency Medicine

## 2018-01-13 ENCOUNTER — Other Ambulatory Visit: Payer: Self-pay

## 2018-01-13 ENCOUNTER — Emergency Department (HOSPITAL_COMMUNITY)
Admission: EM | Admit: 2018-01-13 | Discharge: 2018-01-13 | Disposition: A | Discharge status: Home / Self Care | Payer: No Typology Code available for payment source | Source: Home / Self Care | Attending: Emergency Medicine | Admitting: Emergency Medicine

## 2018-01-13 ENCOUNTER — Emergency Department (HOSPITAL_COMMUNITY): Payer: No Typology Code available for payment source

## 2018-01-13 ENCOUNTER — Emergency Department (HOSPITAL_COMMUNITY)
Admission: EM | Admit: 2018-01-13 | Discharge: 2018-01-13 | Disposition: A | Discharge status: Home / Self Care | Payer: No Typology Code available for payment source | Attending: Emergency Medicine | Admitting: Emergency Medicine

## 2018-01-13 DIAGNOSIS — M542 Cervicalgia: Secondary | ICD-10-CM | POA: Insufficient documentation

## 2018-01-13 DIAGNOSIS — Z87891 Personal history of nicotine dependence: Secondary | ICD-10-CM | POA: Insufficient documentation

## 2018-01-13 DIAGNOSIS — Z21 Asymptomatic human immunodeficiency virus [HIV] infection status: Secondary | ICD-10-CM | POA: Diagnosis not present

## 2018-01-13 DIAGNOSIS — Y9389 Activity, other specified: Secondary | ICD-10-CM | POA: Diagnosis not present

## 2018-01-13 DIAGNOSIS — Y92410 Unspecified street and highway as the place of occurrence of the external cause: Secondary | ICD-10-CM | POA: Insufficient documentation

## 2018-01-13 DIAGNOSIS — M545 Low back pain: Secondary | ICD-10-CM | POA: Diagnosis not present

## 2018-01-13 DIAGNOSIS — Y999 Unspecified external cause status: Secondary | ICD-10-CM | POA: Insufficient documentation

## 2018-01-13 DIAGNOSIS — I10 Essential (primary) hypertension: Secondary | ICD-10-CM | POA: Diagnosis not present

## 2018-01-13 DIAGNOSIS — Z79899 Other long term (current) drug therapy: Secondary | ICD-10-CM | POA: Insufficient documentation

## 2018-01-13 DIAGNOSIS — R0781 Pleurodynia: Secondary | ICD-10-CM | POA: Insufficient documentation

## 2018-01-13 DIAGNOSIS — S39012A Strain of muscle, fascia and tendon of lower back, initial encounter: Secondary | ICD-10-CM

## 2018-01-13 MED ORDER — METHOCARBAMOL 500 MG PO TABS: 500.0000 mg | ORAL_TABLET | Freq: Three times a day (TID) | ORAL | 0 refills | Status: DC | PRN

## 2018-01-13 MED ORDER — IBUPROFEN 800 MG PO TABS: 800.0000 mg | ORAL_TABLET | Freq: Three times a day (TID) | ORAL | 0 refills | Status: DC

## 2018-01-13 MED ORDER — IBUPROFEN 800 MG PO TABS
800.0000 mg | ORAL_TABLET | Freq: Once | ORAL | Status: AC
  Administered 2018-01-13: 800 mg via ORAL
  Filled 2018-01-13: qty 1
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
## 2018-01-13 NOTE — Discharge Instructions (Addendum)
Continue taking your muscle relaxer as directed, you may apply ice on and off to your back.  Take the ibuprofen as directed with food.  Follow-up with your primary provider for recheck in a few days if not improving.
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
## 2018-01-17 ENCOUNTER — Other Ambulatory Visit: Payer: Self-pay

## 2018-01-18 ENCOUNTER — Encounter (INDEPENDENT_AMBULATORY_CARE_PROVIDER_SITE_OTHER): Payer: Self-pay

## 2018-01-18 ENCOUNTER — Other Ambulatory Visit: Payer: Self-pay

## 2018-01-18 VITALS — BP 124/83 | HR 90 | Temp 98.1°F

## 2018-01-18 DIAGNOSIS — B20 Human immunodeficiency virus [HIV] disease: Secondary | ICD-10-CM

## 2018-01-18 DIAGNOSIS — Z006 Encounter for examination for normal comparison and control in clinical research program: Secondary | ICD-10-CM

## 2018-01-19 LAB — COMPREHENSIVE METABOLIC PANEL
AG Ratio: 1.4 (calc) (ref 1.0–2.5)
ALKALINE PHOSPHATASE (APISO): 56 U/L (ref 40–115)
ALT: 21 U/L (ref 9–46)
AST: 23 U/L (ref 10–40)
Albumin: 4.5 g/dL (ref 3.6–5.1)
BUN / CREAT RATIO: 5 (calc) — AB (ref 6–22)
BUN: 6 mg/dL — ABNORMAL LOW (ref 7–25)
CO2: 28 mmol/L (ref 20–32)
Calcium: 9.9 mg/dL (ref 8.6–10.3)
Chloride: 101 mmol/L (ref 98–110)
Creat: 1.27 mg/dL (ref 0.60–1.35)
GLUCOSE: 97 mg/dL (ref 65–99)
Globulin: 3.3 g/dL (calc) (ref 1.9–3.7)
Potassium: 3.7 mmol/L (ref 3.5–5.3)
Sodium: 139 mmol/L (ref 135–146)
Total Bilirubin: 0.4 mg/dL (ref 0.2–1.2)
Total Protein: 7.8 g/dL (ref 6.1–8.1)

## 2018-01-19 LAB — LIPASE: LIPASE: 10 U/L (ref 7–60)

## 2018-01-19 LAB — AMYLASE: AMYLASE: 37 U/L (ref 21–101)

## 2018-01-19 LAB — T-HELPER CELL (CD4) - (RCID CLINIC ONLY)
CD4 T CELL HELPER: 34 % (ref 33–55)
CD4 T Cell Abs: 940 /uL (ref 400–2700)

## 2018-01-19 LAB — CK: Total CK: 338 U/L — ABNORMAL HIGH (ref 44–196)

## 2018-01-19 LAB — PHOSPHORUS: PHOSPHORUS: 3.8 mg/dL (ref 2.5–4.5)

## 2018-01-19 NOTE — Research (Signed)
Participant here for study [id number] for 12 week follow up visit. He is doing well with taking his biktarvy daily as well as his blood pressure medications. He has had no new medical concerns. He had labs for study and clinic collected today. He is scheduled for his next study follow up visit on 04/12/2018.

## 2018-01-20 LAB — HIV-1 RNA QUANT-NO REFLEX-BLD
HIV 1 RNA Quant: <20 copies/mL — AB
HIV-1 RNA QUANT, LOG: DETECTED {Log_copies}/mL — AB

## 2018-01-20 LAB — RPR: RPR Ser Ql: NONREACTIVE

## 2018-01-31 ENCOUNTER — Encounter: Payer: Self-pay | Admitting: Family

## 2018-02-05 ENCOUNTER — Other Ambulatory Visit: Payer: Self-pay

## 2018-02-05 ENCOUNTER — Encounter (HOSPITAL_COMMUNITY): Payer: Self-pay

## 2018-02-05 ENCOUNTER — Emergency Department (HOSPITAL_COMMUNITY)
Admission: EM | Admit: 2018-02-05 | Discharge: 2018-02-05 | Disposition: A | Discharge status: Home / Self Care | Payer: Self-pay | Attending: Emergency Medicine | Admitting: Emergency Medicine

## 2018-02-05 DIAGNOSIS — Z79899 Other long term (current) drug therapy: Secondary | ICD-10-CM | POA: Insufficient documentation

## 2018-02-05 DIAGNOSIS — J020 Streptococcal pharyngitis: Secondary | ICD-10-CM | POA: Insufficient documentation

## 2018-02-05 DIAGNOSIS — I10 Essential (primary) hypertension: Secondary | ICD-10-CM | POA: Insufficient documentation

## 2018-02-05 DIAGNOSIS — Z87891 Personal history of nicotine dependence: Secondary | ICD-10-CM | POA: Insufficient documentation

## 2018-02-05 LAB — INFLUENZA PANEL BY PCR (TYPE A & B)
Influenza A By PCR: NEGATIVE
Influenza B By PCR: NEGATIVE

## 2018-02-05 LAB — GROUP A STREP BY PCR: Group A Strep by PCR: DETECTED — AB

## 2018-02-05 MED ORDER — PENICILLIN G BENZATHINE 1200000 UNIT/2ML IM SUSP
1.2000 10*6.[IU] | Freq: Once | INTRAMUSCULAR | Status: AC
  Administered 2018-02-05: 1.2 10*6.[IU] via INTRAMUSCULAR
  Filled 2018-02-05: qty 2

## 2018-02-05 MED ORDER — IBUPROFEN 800 MG PO TABS
800.0000 mg | ORAL_TABLET | Freq: Once | ORAL | Status: AC
  Administered 2018-02-05: 800 mg via ORAL
  Filled 2018-02-05: qty 1
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
## 2018-02-05 NOTE — ED Triage Notes (Signed)
Pt states he is having generalized body aches. Pt states that his lymph nodes are swollen and he is congested. Pt states this has been going on for 2-3 days

## 2018-02-05 NOTE — Discharge Instructions (Addendum)
Patient to throw away toothbrush, toothpaste, any contaminated items. °Continue with Motrin and Tylenol for fevers and pain. °Check with your doctor, return to ER for worsening or concerning symptoms. °

## 2018-02-06 ENCOUNTER — Encounter: Payer: Self-pay | Admitting: Nurse Practitioner

## 2018-02-09 ENCOUNTER — Other Ambulatory Visit: Payer: Self-pay | Admitting: Family

## 2018-02-09 DIAGNOSIS — B20 Human immunodeficiency virus [HIV] disease: Secondary | ICD-10-CM

## 2018-02-13 ENCOUNTER — Other Ambulatory Visit: Payer: Self-pay

## 2018-02-13 DIAGNOSIS — B20 Human immunodeficiency virus [HIV] disease: Secondary | ICD-10-CM

## 2018-02-13 MED ORDER — BICTEGRAVIR-EMTRICITAB-TENOFOV 50-200-25 MG PO TABS: 1.0000 | ORAL_TABLET | Freq: Every day | ORAL | 0 refills | Status: DC

## 2018-02-15 ENCOUNTER — Encounter: Payer: Self-pay | Admitting: Family

## 2018-02-28 ENCOUNTER — Ambulatory Visit: Payer: Self-pay | Admitting: Family

## 2018-03-01 ENCOUNTER — Ambulatory Visit: Payer: Self-pay | Admitting: Family

## 2018-03-05 ENCOUNTER — Encounter: Payer: Self-pay | Admitting: Family

## 2018-03-05 ENCOUNTER — Ambulatory Visit (INDEPENDENT_AMBULATORY_CARE_PROVIDER_SITE_OTHER): Payer: Self-pay | Admitting: Family

## 2018-03-05 VITALS — BP 173/95 | HR 77 | Temp 98.3°F | Wt 235.0 lb

## 2018-03-05 DIAGNOSIS — Z23 Encounter for immunization: Secondary | ICD-10-CM

## 2018-03-05 DIAGNOSIS — Z0000 Encounter for general adult medical examination without abnormal findings: Secondary | ICD-10-CM

## 2018-03-05 DIAGNOSIS — Z113 Encounter for screening for infections with a predominantly sexual mode of transmission: Secondary | ICD-10-CM

## 2018-03-05 DIAGNOSIS — I10 Essential (primary) hypertension: Secondary | ICD-10-CM

## 2018-03-05 DIAGNOSIS — B20 Human immunodeficiency virus [HIV] disease: Secondary | ICD-10-CM

## 2018-03-05 MED ORDER — HYDROCHLOROTHIAZIDE 25 MG PO TABS: 25.0000 mg | ORAL_TABLET | Freq: Every day | ORAL | 1 refills | Status: DC

## 2018-03-05 MED ORDER — BICTEGRAVIR-EMTRICITAB-TENOFOV 50-200-25 MG PO TABS: 1.0000 | ORAL_TABLET | Freq: Every day | ORAL | 3 refills | Status: DC

## 2018-03-05 MED ORDER — AMLODIPINE BESYLATE 10 MG PO TABS: 10.0000 mg | ORAL_TABLET | Freq: Every day | ORAL | 1 refills | Status: DC

## 2018-03-05 NOTE — Assessment & Plan Note (Signed)
Douglas Bridges has well controlled HIV disease with his current regimen of Biktarvy with good tolerance and adherence. No signs/symptoms of opportunistic infection or progressive HIV disease at present. UMAP has been renewed. Continue current dose of Biktarvy. Plan for follow up in 2 months or sooner if needed with lab work 1-2 weeks prior to appointment.

## 2018-03-05 NOTE — Assessment & Plan Note (Signed)
Blood pressure poorly controlled with less than optimal adherence to his regimen. Discussed importance of taking blood pressure medications to reduce risk of future cardiovascular and renal disease in the future. No current red flag symptoms and denies worst headache of life. Restart amlodipine and hydrochlorothiazide. May need additional work up if blood pressure remains poorly controlled. Will recheck in 2 weeks with nurse visit.

## 2018-03-05 NOTE — Patient Instructions (Signed)
Nice to see you!  Continue to take your Ronks as prescribed daily.  Restart taking your blood pressure medications.  Schedule a nurse visit in 2 weeks to recheck blood pressure.  Plan for follow up in 2 months or sooner if needed with lab work 1-2 weeks prior to appointment.
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
## 2018-03-05 NOTE — Assessment & Plan Note (Signed)
   Menveo and Pneumovax updated today.  Dental exam up to date.  Discussed importance of safe sexual practice to reduce risk of acquisition and transmission of STI.

## 2018-03-14 ENCOUNTER — Ambulatory Visit: Payer: Self-pay | Attending: Nurse Practitioner | Admitting: Physician Assistant

## 2018-03-14 VITALS — BP 135/91 | HR 94 | Temp 97.8°F | Resp 16 | Wt 232.4 lb

## 2018-03-14 DIAGNOSIS — R7303 Prediabetes: Secondary | ICD-10-CM

## 2018-03-14 DIAGNOSIS — I10 Essential (primary) hypertension: Secondary | ICD-10-CM

## 2018-03-14 DIAGNOSIS — G43709 Chronic migraine without aura, not intractable, without status migrainosus: Secondary | ICD-10-CM

## 2018-03-14 DIAGNOSIS — B20 Human immunodeficiency virus [HIV] disease: Secondary | ICD-10-CM

## 2018-03-14 DIAGNOSIS — H539 Unspecified visual disturbance: Secondary | ICD-10-CM

## 2018-03-14 MED ORDER — IBUPROFEN 800 MG PO TABS: 800.0000 mg | ORAL_TABLET | Freq: Three times a day (TID) | ORAL | 0 refills | Status: DC
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
## 2018-03-15 LAB — HEMOGLOBIN A1C
Est. average glucose Bld gHb Est-mCnc: 120 mg/dL
HEMOGLOBIN A1C: 5.8 % — AB (ref 4.8–5.6)

## 2018-03-15 LAB — MICROALBUMIN / CREATININE URINE RATIO
Creatinine, Urine: 155.3 mg/dL
Microalb/Creat Ratio: 25 mg/g creat (ref 0–29)
Microalbumin, Urine: 38.4 ug/mL

## 2018-03-26 ENCOUNTER — Telehealth: Payer: Self-pay

## 2018-03-26 NOTE — Telephone Encounter (Signed)
Contacted pt to go over lab results pt didn't answer left a detailed vm informing pt of results and if he has any questions or concerns to give me a call  

## 2018-03-28 ENCOUNTER — Other Ambulatory Visit: Payer: Self-pay

## 2018-03-28 ENCOUNTER — Encounter (HOSPITAL_BASED_OUTPATIENT_CLINIC_OR_DEPARTMENT_OTHER): Payer: Self-pay

## 2018-03-28 ENCOUNTER — Emergency Department (HOSPITAL_BASED_OUTPATIENT_CLINIC_OR_DEPARTMENT_OTHER)
Admission: EM | Admit: 2018-03-28 | Discharge: 2018-03-29 | Disposition: A | Discharge status: Home / Self Care | Payer: No Typology Code available for payment source | Attending: Emergency Medicine | Admitting: Emergency Medicine

## 2018-03-28 ENCOUNTER — Emergency Department (HOSPITAL_BASED_OUTPATIENT_CLINIC_OR_DEPARTMENT_OTHER): Payer: No Typology Code available for payment source

## 2018-03-28 DIAGNOSIS — Y999 Unspecified external cause status: Secondary | ICD-10-CM | POA: Insufficient documentation

## 2018-03-28 DIAGNOSIS — S20211A Contusion of right front wall of thorax, initial encounter: Secondary | ICD-10-CM | POA: Diagnosis not present

## 2018-03-28 DIAGNOSIS — Y92512 Supermarket, store or market as the place of occurrence of the external cause: Secondary | ICD-10-CM | POA: Diagnosis not present

## 2018-03-28 DIAGNOSIS — Z87891 Personal history of nicotine dependence: Secondary | ICD-10-CM | POA: Insufficient documentation

## 2018-03-28 DIAGNOSIS — S3993XA Unspecified injury of pelvis, initial encounter: Secondary | ICD-10-CM | POA: Diagnosis present

## 2018-03-28 DIAGNOSIS — I10 Essential (primary) hypertension: Secondary | ICD-10-CM | POA: Insufficient documentation

## 2018-03-28 DIAGNOSIS — Y9389 Activity, other specified: Secondary | ICD-10-CM | POA: Diagnosis not present

## 2018-03-28 MED ORDER — METHOCARBAMOL 500 MG PO TABS
1000.0000 mg | ORAL_TABLET | Freq: Once | ORAL | Status: AC
  Administered 2018-03-28: 1000 mg via ORAL
  Filled 2018-03-28: qty 2

## 2018-03-28 MED ORDER — ACETAMINOPHEN 500 MG PO TABS
1000.0000 mg | ORAL_TABLET | Freq: Once | ORAL | Status: AC
  Administered 2018-03-28: 1000 mg via ORAL
  Filled 2018-03-28: qty 2
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
## 2018-03-29 MED ORDER — LIDOCAINE 5 % EX PTCH: 1.0000 | MEDICATED_PATCH | CUTANEOUS | 0 refills | Status: DC

## 2018-03-29 MED ORDER — METHOCARBAMOL 500 MG PO TABS: 500.0000 mg | ORAL_TABLET | Freq: Two times a day (BID) | ORAL | 0 refills | Status: DC

## 2018-04-12 ENCOUNTER — Other Ambulatory Visit: Payer: Self-pay

## 2018-04-12 ENCOUNTER — Encounter (INDEPENDENT_AMBULATORY_CARE_PROVIDER_SITE_OTHER): Payer: Self-pay

## 2018-04-12 VITALS — BP 117/76 | HR 88 | Temp 98.4°F | Wt 232.0 lb

## 2018-04-12 DIAGNOSIS — B20 Human immunodeficiency virus [HIV] disease: Secondary | ICD-10-CM

## 2018-04-12 DIAGNOSIS — Z006 Encounter for examination for normal comparison and control in clinical research program: Secondary | ICD-10-CM

## 2018-04-12 NOTE — Research (Signed)
Participant here for [id number] study for follow up visit. He is still doing well with taking his BP medication and his Biktarvy daily. He is here with his partner who is supportive in reminding him to dose daily. They have both found support in scheduling their appointments together and picking up their medications at the same time. He is scheduled for his next study visit in June 2020.

## 2018-04-19 ENCOUNTER — Other Ambulatory Visit: Payer: Self-pay

## 2018-04-19 LAB — CBC
CD4%: 45.7 % (ref 30.8–58.5)
CD4: 1188 /uL (ref 359–1519)
CD8 % Suppressor T Cell: 20.3 % (ref 12–35.5)
CD8: 528 /uL (ref 109–897)
HCT: 40 % (ref 29–41)
Hemoglobin: 13.3 g/dL (ref 13–17.7)

## 2018-04-20 ENCOUNTER — Other Ambulatory Visit: Payer: Self-pay

## 2018-04-20 LAB — HIV-1 RNA QUANT-NO REFLEX-BLD
HIV 1 RNA Quant: 27 copies/mL — ABNORMAL HIGH
HIV-1 RNA Quant, Log: 1.43 Log copies/mL — ABNORMAL HIGH

## 2018-04-20 LAB — COMPREHENSIVE METABOLIC PANEL
AG Ratio: 1.3 (calc) (ref 1.0–2.5)
ALT: 14 U/L (ref 9–46)
AST: 18 U/L (ref 10–40)
Albumin: 4.6 g/dL (ref 3.6–5.1)
Alkaline phosphatase (APISO): 63 U/L (ref 36–130)
BUN: 9 mg/dL (ref 7–25)
CO2: 30 mmol/L (ref 20–32)
Calcium: 10.3 mg/dL (ref 8.6–10.3)
Chloride: 99 mmol/L (ref 98–110)
Creat: 1.27 mg/dL (ref 0.60–1.35)
Globulin: 3.5 g/dL (calc) (ref 1.9–3.7)
Glucose, Bld: 102 mg/dL — ABNORMAL HIGH (ref 65–99)
Potassium: 3.4 mmol/L — ABNORMAL LOW (ref 3.5–5.3)
Sodium: 138 mmol/L (ref 135–146)
Total Bilirubin: 0.4 mg/dL (ref 0.2–1.2)
Total Protein: 8.1 g/dL (ref 6.1–8.1)

## 2018-04-20 LAB — CK: Total CK: 408 U/L — ABNORMAL HIGH (ref 44–196)

## 2018-04-20 LAB — PHOSPHORUS: Phosphorus: 3.3 mg/dL (ref 2.5–4.5)

## 2018-04-20 LAB — LIPASE: Lipase: 13 U/L (ref 7–60)

## 2018-04-20 LAB — AMYLASE: Amylase: 35 U/L (ref 21–101)

## 2018-04-23 ENCOUNTER — Other Ambulatory Visit: Payer: Self-pay

## 2018-04-26 ENCOUNTER — Other Ambulatory Visit: Payer: Self-pay

## 2018-04-26 DIAGNOSIS — I10 Essential (primary) hypertension: Secondary | ICD-10-CM

## 2018-04-26 MED ORDER — AMLODIPINE BESYLATE 10 MG PO TABS: 10.0000 mg | ORAL_TABLET | Freq: Every day | ORAL | 1 refills | Status: DC

## 2018-05-04 ENCOUNTER — Other Ambulatory Visit: Payer: Self-pay

## 2018-05-04 ENCOUNTER — Ambulatory Visit (INDEPENDENT_AMBULATORY_CARE_PROVIDER_SITE_OTHER): Payer: Self-pay | Admitting: Family

## 2018-05-04 ENCOUNTER — Encounter: Payer: Self-pay | Admitting: Family

## 2018-05-04 DIAGNOSIS — B20 Human immunodeficiency virus [HIV] disease: Secondary | ICD-10-CM

## 2018-05-04 DIAGNOSIS — I10 Essential (primary) hypertension: Secondary | ICD-10-CM

## 2018-05-04 MED ORDER — BICTEGRAVIR-EMTRICITAB-TENOFOV 50-200-25 MG PO TABS: 1.0000 | ORAL_TABLET | Freq: Every day | ORAL | 3 refills | Status: DC

## 2018-05-04 MED ORDER — HYDROCHLOROTHIAZIDE 25 MG PO TABS: 25.0000 mg | ORAL_TABLET | Freq: Every day | ORAL | 1 refills | Status: AC

## 2018-05-04 MED ORDER — AMLODIPINE BESYLATE 10 MG PO TABS: 10.0000 mg | ORAL_TABLET | Freq: Every day | ORAL | 1 refills | Status: AC

## 2018-05-04 NOTE — Assessment & Plan Note (Signed)
Mr. Douglas Bridges has well-controlled HIV disease with good adherence and tolerance to his ART regimen of Biktarvy.  No symptoms of opportunistic infection at present.  He has no problems obtaining his medication.  Continue current dose of Biktarvy.  Plan for follow-up in 3 months or sooner if needed with lab work during appointment.

## 2018-05-04 NOTE — Patient Instructions (Signed)
Nice to speak with you.  Please continue to take your Colton as prescribed daily.  We will plan to follow-up in 3 months or sooner if needed lab work at your appointment.  Have a great day and stay safe!
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
## 2018-05-04 NOTE — Assessment & Plan Note (Signed)
Not currently able to monitor blood pressure at home as he does not have equipment.  Denies headaches or changes in vision.  No symptoms of endorgan damage at present.  Refill and continue current dose of amlodipine and hydrochlorothiazide.

## 2018-06-15 ENCOUNTER — Other Ambulatory Visit: Payer: Self-pay | Admitting: Nurse Practitioner

## 2018-06-15 ENCOUNTER — Ambulatory Visit: Payer: Self-pay | Attending: Nurse Practitioner | Admitting: Nurse Practitioner

## 2018-06-15 ENCOUNTER — Encounter: Payer: Self-pay | Admitting: Nurse Practitioner

## 2018-06-15 ENCOUNTER — Other Ambulatory Visit: Payer: Self-pay | Admitting: Physician Assistant

## 2018-06-15 ENCOUNTER — Other Ambulatory Visit: Payer: Self-pay

## 2018-06-15 DIAGNOSIS — B20 Human immunodeficiency virus [HIV] disease: Secondary | ICD-10-CM

## 2018-06-15 DIAGNOSIS — L299 Pruritus, unspecified: Secondary | ICD-10-CM

## 2018-06-15 DIAGNOSIS — I10 Essential (primary) hypertension: Secondary | ICD-10-CM

## 2018-06-15 DIAGNOSIS — G43709 Chronic migraine without aura, not intractable, without status migrainosus: Secondary | ICD-10-CM

## 2018-06-15 MED ORDER — HYDROXYZINE HCL 10 MG PO TABS: 10.0000 mg | ORAL_TABLET | Freq: Four times a day (QID) | ORAL | 0 refills | Status: DC | PRN

## 2018-06-15 NOTE — Telephone Encounter (Signed)
Possible interaction warning with a study drug came up, please approve if appropriate.

## 2018-06-15 NOTE — Telephone Encounter (Signed)
Refill request
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
## 2018-06-26 ENCOUNTER — Telehealth: Payer: Self-pay | Admitting: Nurse Practitioner

## 2018-06-26 NOTE — Telephone Encounter (Signed)
Patients call taken.  Patient identified by name and date of birth.  Patient complaining of heartburn worse than normal.  Patient states anything he eats causes severe pain.  Patient taking several different OTC medications without relief.  Patient given appointment for tomorrow.  Patient acknowledged understanding of advice.   Patient acknowledged understanding of advice.

## 2018-06-26 NOTE — Telephone Encounter (Signed)
Patient called stating he has been having heartburn and it has been killing him. Please follow up

## 2018-06-27 ENCOUNTER — Other Ambulatory Visit: Payer: Self-pay

## 2018-06-27 ENCOUNTER — Ambulatory Visit: Payer: Self-pay | Attending: Nurse Practitioner | Admitting: Nurse Practitioner

## 2018-07-06 ENCOUNTER — Other Ambulatory Visit: Payer: Self-pay

## 2018-07-06 ENCOUNTER — Encounter: Payer: Self-pay | Admitting: Nurse Practitioner

## 2018-07-06 ENCOUNTER — Ambulatory Visit: Payer: No Typology Code available for payment source | Attending: Nurse Practitioner | Admitting: Nurse Practitioner

## 2018-07-06 DIAGNOSIS — Z9189 Other specified personal risk factors, not elsewhere classified: Secondary | ICD-10-CM

## 2018-07-06 DIAGNOSIS — R51 Headache: Secondary | ICD-10-CM

## 2018-07-06 DIAGNOSIS — R5383 Other fatigue: Secondary | ICD-10-CM
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
## 2018-07-09 ENCOUNTER — Encounter: Payer: Self-pay | Admitting: Nurse Practitioner

## 2018-07-09 ENCOUNTER — Other Ambulatory Visit: Payer: Self-pay

## 2018-07-09 ENCOUNTER — Ambulatory Visit: Payer: Self-pay | Attending: Nurse Practitioner

## 2018-07-09 DIAGNOSIS — I10 Essential (primary) hypertension: Secondary | ICD-10-CM

## 2018-07-10 LAB — BASIC METABOLIC PANEL
BUN/Creatinine Ratio: 6 — ABNORMAL LOW (ref 9–20)
BUN: 8 mg/dL (ref 6–20)
CO2: 23 mmol/L (ref 20–29)
Calcium: 9.8 mg/dL (ref 8.7–10.2)
Chloride: 101 mmol/L (ref 96–106)
Creatinine, Ser: 1.28 mg/dL — ABNORMAL HIGH (ref 0.76–1.27)
GFR calc Af Amer: 86 mL/min/{1.73_m2} (ref >59)
GFR calc non Af Amer: 75 mL/min/{1.73_m2} (ref >59)
Glucose: 107 mg/dL — ABNORMAL HIGH (ref 65–99)
Potassium: 3.5 mmol/L (ref 3.5–5.2)
Sodium: 142 mmol/L (ref 134–144)

## 2018-07-10 LAB — LIPID PANEL
Chol/HDL Ratio: 6.6 ratio — ABNORMAL HIGH (ref 0.0–5.0)
Cholesterol, Total: 184 mg/dL (ref 100–199)
HDL: 28 mg/dL — ABNORMAL LOW (ref >39)
LDL Calculated: 136 mg/dL — ABNORMAL HIGH (ref 0–99)
Triglycerides: 99 mg/dL (ref 0–149)
VLDL Cholesterol Cal: 20 mg/dL (ref 5–40)

## 2018-07-13 ENCOUNTER — Emergency Department (HOSPITAL_COMMUNITY)
Admission: EM | Admit: 2018-07-13 | Discharge: 2018-07-13 | Disposition: A | Discharge status: Home / Self Care | Payer: HRSA Program | Attending: Emergency Medicine | Admitting: Emergency Medicine

## 2018-07-13 ENCOUNTER — Emergency Department (HOSPITAL_COMMUNITY): Payer: HRSA Program

## 2018-07-13 ENCOUNTER — Other Ambulatory Visit: Payer: Self-pay

## 2018-07-13 ENCOUNTER — Encounter (HOSPITAL_COMMUNITY): Payer: Self-pay

## 2018-07-13 DIAGNOSIS — Z87891 Personal history of nicotine dependence: Secondary | ICD-10-CM | POA: Insufficient documentation

## 2018-07-13 DIAGNOSIS — Z79899 Other long term (current) drug therapy: Secondary | ICD-10-CM | POA: Insufficient documentation

## 2018-07-13 DIAGNOSIS — J029 Acute pharyngitis, unspecified: Secondary | ICD-10-CM | POA: Diagnosis present

## 2018-07-13 DIAGNOSIS — U071 COVID-19: Secondary | ICD-10-CM | POA: Insufficient documentation

## 2018-07-13 DIAGNOSIS — Z21 Asymptomatic human immunodeficiency virus [HIV] infection status: Secondary | ICD-10-CM | POA: Diagnosis not present

## 2018-07-13 DIAGNOSIS — I10 Essential (primary) hypertension: Secondary | ICD-10-CM | POA: Diagnosis not present

## 2018-07-13 DIAGNOSIS — J020 Streptococcal pharyngitis: Secondary | ICD-10-CM | POA: Insufficient documentation

## 2018-07-13 LAB — GROUP A STREP BY PCR: Group A Strep by PCR: DETECTED — AB

## 2018-07-13 MED ORDER — ACETAMINOPHEN 500 MG PO TABS
1000.0000 mg | ORAL_TABLET | Freq: Once | ORAL | Status: AC
  Administered 2018-07-13: 1000 mg via ORAL
  Filled 2018-07-13: qty 2

## 2018-07-13 MED ORDER — METHYLPREDNISOLONE SODIUM SUCC 125 MG IJ SOLR
125.0000 mg | Freq: Once | INTRAMUSCULAR | Status: AC
  Administered 2018-07-13: 125 mg via INTRAVENOUS
  Filled 2018-07-13: qty 2

## 2018-07-13 MED ORDER — AMOXICILLIN-POT CLAVULANATE 875-125 MG PO TABS: 1.0000 | ORAL_TABLET | Freq: Two times a day (BID) | ORAL | 0 refills | Status: DC

## 2018-07-13 MED ORDER — CLINDAMYCIN PHOSPHATE 900 MG/50ML IV SOLN
900.0000 mg | Freq: Once | INTRAVENOUS | Status: AC
  Administered 2018-07-13: 03:00:00 900 mg via INTRAVENOUS
  Filled 2018-07-13: qty 50

## 2018-07-13 MED ORDER — IBUPROFEN 200 MG PO TABS
400.0000 mg | ORAL_TABLET | Freq: Once | ORAL | Status: AC
  Administered 2018-07-13: 400 mg via ORAL
  Filled 2018-07-13: qty 2

## 2018-07-13 MED ORDER — CALCIUM CARBONATE ANTACID 500 MG PO CHEW: 1.0000 | CHEWABLE_TABLET | Freq: Once | ORAL | Status: DC

## 2018-07-13 MED ORDER — SODIUM CHLORIDE 0.9 % IV BOLUS
1000.0000 mL | Freq: Once | INTRAVENOUS | Status: AC
  Administered 2018-07-13: 1000 mL via INTRAVENOUS

## 2018-07-13 NOTE — ED Triage Notes (Signed)
Pt reports sore throat starting today. Tonsils noteably swollen. Voice muffled. Denies SOB, but states that it hurts to swallow.
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
## 2018-07-14 LAB — NOVEL CORONAVIRUS, NAA (HOSP ORDER, SEND-OUT TO REF LAB; TAT 18-24 HRS): SARS-CoV-2, NAA: DETECTED — AB

## 2018-07-15 ENCOUNTER — Other Ambulatory Visit: Payer: Self-pay | Admitting: Nurse Practitioner

## 2018-07-15 DIAGNOSIS — L299 Pruritus, unspecified: Secondary | ICD-10-CM

## 2018-07-16 ENCOUNTER — Telehealth: Payer: Self-pay | Admitting: *Deleted

## 2018-07-16 NOTE — Telephone Encounter (Signed)
Agree with plan. Fine to take antibiotics with Biktarvy. Agree to treat symptoms at home and return to hospital if shortness of breath develops/worsens.

## 2018-07-16 NOTE — Telephone Encounter (Signed)
Patient called to report that 07/13/18 he tested positive for Covid 19 and strep. He was given Augmentin for the Strep and he wanted to know if he could take that with his HIV medications. Advised him yes he can take both. He also states he was told to treat the symptoms of Covid as they come and he just wanted Marya Amsler to know he has it. Advised him will let Marya Amsler NP know and someone will call if he has any further instructions.

## 2018-07-30 ENCOUNTER — Other Ambulatory Visit (HOSPITAL_COMMUNITY)
Admission: RE | Admit: 2018-07-30 | Discharge: 2018-07-30 | Disposition: A | Discharge status: Home / Self Care | Payer: HRSA Program | Source: Ambulatory Visit | Attending: Internal Medicine | Admitting: Internal Medicine

## 2018-07-30 DIAGNOSIS — Z20828 Contact with and (suspected) exposure to other viral communicable diseases: Secondary | ICD-10-CM | POA: Insufficient documentation

## 2018-07-30 DIAGNOSIS — Z1159 Encounter for screening for other viral diseases: Secondary | ICD-10-CM | POA: Diagnosis present

## 2018-07-30 LAB — SARS CORONAVIRUS 2 (TAT 6-24 HRS): SARS Coronavirus 2: NEGATIVE

## 2018-08-01 ENCOUNTER — Ambulatory Visit: Payer: Self-pay

## 2018-08-02 ENCOUNTER — Ambulatory Visit (HOSPITAL_BASED_OUTPATIENT_CLINIC_OR_DEPARTMENT_OTHER): Payer: No Typology Code available for payment source

## 2018-08-03 ENCOUNTER — Ambulatory Visit (HOSPITAL_BASED_OUTPATIENT_CLINIC_OR_DEPARTMENT_OTHER): Payer: No Typology Code available for payment source

## 2018-08-03 ENCOUNTER — Encounter: Payer: Self-pay | Admitting: Family Medicine

## 2018-08-03 NOTE — Telephone Encounter (Signed)
Spoke with patient due to his previous message sent via his mychart. Per pt he talked to the sleep study people already and they informed him that he can come back today to do the sleep study. Per pt everything is token care of.

## 2018-08-06 ENCOUNTER — Other Ambulatory Visit: Payer: Self-pay

## 2018-08-06 ENCOUNTER — Encounter: Payer: Self-pay | Admitting: Family

## 2018-08-06 ENCOUNTER — Ambulatory Visit: Payer: Self-pay

## 2018-08-06 ENCOUNTER — Ambulatory Visit (HOSPITAL_BASED_OUTPATIENT_CLINIC_OR_DEPARTMENT_OTHER): Payer: Self-pay | Attending: Nurse Practitioner | Admitting: Internal Medicine

## 2018-08-06 DIAGNOSIS — R0683 Snoring: Secondary | ICD-10-CM | POA: Insufficient documentation

## 2018-08-06 DIAGNOSIS — Z9189 Other specified personal risk factors, not elsewhere classified: Secondary | ICD-10-CM | POA: Insufficient documentation

## 2018-08-06 DIAGNOSIS — R5383 Other fatigue: Secondary | ICD-10-CM | POA: Insufficient documentation

## 2018-08-07 NOTE — Progress Notes (Signed)
The patient's phone began ringing/ alarming at [place]. There were multiple request to turn his phone off. At 0056, the patient stated he had an emergency and requested to end the study. The study was ended and leads were removed. The patient left AMA.

## 2018-08-08 DIAGNOSIS — R5383 Other fatigue: Secondary | ICD-10-CM

## 2018-08-08 NOTE — Procedures (Signed)
   Patient Name: Douglas Bridges, Douglas Bridges Date: 08/06/2018 Gender: Male D.O.B: 09/05/1990 Age (years): 30 Referring Provider: Gildardo Pounds NP Height (inches): 74 Interpreting Physician: Baird Lyons MD, ABSM Weight (lbs): 229 RPSGT: Laren Everts BMI: 29 MRN: [id number] Neck Size: 18.00  CLINICAL INFORMATION Sleep Study Type: NPSG Indication for sleep study: Excessive Daytime Sleepiness, Fatigue, Hypertension, Morning Headaches, Snoring Epworth Sleepiness Score: 8  SLEEP STUDY TECHNIQUE As per the AASM Manual for the Scoring of Sleep and Associated Events v2.3 (April 2016) with a hypopnea requiring 4% desaturations.  The channels recorded and monitored were frontal, central and occipital EEG, electrooculogram (EOG), submentalis EMG (chin), nasal and oral airflow, thoracic and abdominal wall motion, anterior tibialis EMG, snore microphone, electrocardiogram, and pulse oximetry.  MEDICATIONS Medications self-administered by patient taken the night of the study : GABAPENTIN  SLEEP ARCHITECTURE The study was initiated at 9:44:58 PM and ended at 12:56:44 AM.  Sleep onset time was 11.5 minutes and the sleep efficiency was 87.9%%. The total sleep time was 168.5 minutes.  Stage REM latency was 75.0 minutes.  The patient spent 5.9%% of the night in stage N1 sleep, 78.6%% in stage N2 sleep, 0.9%% in stage N3 and 14.5% in REM.  Alpha intrusion was absent.  Supine sleep was 25.22%.  RESPIRATORY PARAMETERS The overall apnea/hypopnea index (AHI) was 3.2 per hour. There were 0 total apneas, including 0 obstructive, 0 central and 0 mixed apneas. There were 9 hypopneas and 16 RERAs.  The AHI during Stage REM sleep was 7.3 per hour.  AHI while supine was 12.7 per hour.  The mean oxygen saturation was 95.7%. The minimum SpO2 during sleep was 90.0%.  moderate snoring was noted during this study.  CARDIAC DATA The 2 lead EKG demonstrated sinus rhythm. The mean heart rate was 74.3  beats per minute. Other EKG findings include: None.  LEG MOVEMENT DATA The total PLMS were 0 with a resulting PLMS index of 0.0. Associated arousal with leg movement index was 0.0 .  IMPRESSIONS - No significant obstructive sleep apnea occurred during this study (AHI = 3.2/h). - Study was ended by patient who reported emergency notification by his phone. Total sleep time 168 minutes.  - No significant central sleep apnea occurred during this study (CAI = 0.0/h). - The patient had minimal or no oxygen desaturation during the study (Min O2 = 90.0%) - The patient snored with moderate snoring volume. - No cardiac abnormalities were noted during this study. - Clinically significant periodic limb movements did not occur during sleep. No significant associated arousals.  DIAGNOSIS - Primary snoring  RECOMMENDATIONS - Apnea events were within normal limits on this abbreviated study. Contact [facility] if rescheduling would be clinically necessary. - Be careful with alcohol, sedatives and other CNS depressants that may worsen sleep apnea and disrupt normal sleep architecture. - Sleep hygiene should be reviewed to assess factors that may improve sleep quality. - Weight management and regular exercise should be initiated or continued if appropriate.  [Electronically signed] 08/08/2018 02:10 PM  Baird Lyons MD, [place], American Board of Sleep Medicine   NPI: [id number]                          [place], [place] [facility]  ELECTRONICALLY SIGNED ON:  08/08/2018, 2:06 PM [place] PH: [phone]   FX: [phone] [place]

## 2018-08-15 ENCOUNTER — Telehealth: Payer: Self-pay | Admitting: Family

## 2018-08-15 NOTE — Telephone Encounter (Signed)
COVID-19 Pre-Screening Questions: ° °Do you currently have a fever (>100 °F), chills or unexplained body aches? N ° °Are you currently experiencing new cough, shortness of breath, sore throat, runny nose? N °•  °Have you recently travelled outside the state of [place] in the last 14 days? N  °•  °Have you been in contact with someone that is currently pending confirmation of Covid19 testing or has been confirmed to have the Covid19 virus?  N ° °**If the patient answers NO to ALL questions -  advise the patient to please call the clinic before coming to the office should any symptoms develop.  ° ° ° °

## 2018-08-16 ENCOUNTER — Encounter: Payer: Self-pay | Admitting: Family

## 2018-08-16 ENCOUNTER — Encounter (INDEPENDENT_AMBULATORY_CARE_PROVIDER_SITE_OTHER): Payer: Self-pay | Admitting: *Deleted

## 2018-08-16 ENCOUNTER — Ambulatory Visit (INDEPENDENT_AMBULATORY_CARE_PROVIDER_SITE_OTHER): Payer: Self-pay | Admitting: Family

## 2018-08-16 ENCOUNTER — Other Ambulatory Visit: Payer: Self-pay

## 2018-08-16 VITALS — BP 138/94 | HR 81 | Temp 98.0°F

## 2018-08-16 VITALS — BP 141/90 | HR 82 | Temp 98.5°F

## 2018-08-16 DIAGNOSIS — I10 Essential (primary) hypertension: Secondary | ICD-10-CM

## 2018-08-16 DIAGNOSIS — B20 Human immunodeficiency virus [HIV] disease: Secondary | ICD-10-CM

## 2018-08-16 DIAGNOSIS — Z006 Encounter for examination for normal comparison and control in clinical research program: Secondary | ICD-10-CM

## 2018-08-16 DIAGNOSIS — Z113 Encounter for screening for infections with a predominantly sexual mode of transmission: Secondary | ICD-10-CM

## 2018-08-16 DIAGNOSIS — Z0000 Encounter for general adult medical examination without abnormal findings: Secondary | ICD-10-CM

## 2018-08-16 MED ORDER — BIKTARVY 50-200-25 MG PO TABS: 1.0000 | ORAL_TABLET | Freq: Every day | ORAL | 5 refills | Status: AC

## 2018-08-16 NOTE — Assessment & Plan Note (Signed)
Douglas Bridges appears to have well-controlled HIV disease with good adherence and tolerance to his ART regimen of Biktarvy.  No signs/symptoms of opportunistic infection or progressive HIV disease.  Has no problems obtaining his medication from the pharmacy and his financial assistance has been renewed.  Continue current dose of Biktarvy.  Plan for follow-up in 4 months or sooner if needed with lab work 1 to 2 weeks prior to appointment.

## 2018-08-16 NOTE — Patient Instructions (Signed)
Nice to see you!  Congratulations on your marriage!  Please continue take your Biktarvy as prescribed daily.  Refills have been sent to your pharmacy.  Recommend scheduling a time for your flu shot in September.  Plan for follow-up in 4 months or sooner if needed with lab work 1 to 2 weeks prior to appointment around the same day.  Have a great day and stay safe!  

## 2018-08-16 NOTE — Assessment & Plan Note (Signed)
   Discussed importance of safe sexual practice to reduce risk of acquisition/transmission of STI.  Recommend scheduling appointment for influenza vaccination September

## 2018-08-16 NOTE — Research (Signed)
Douglas Bridges is here for his final HPTN study visit. It has been approximately 1 year since his last injection. He missed his last visit for week 36 due to being out sick with Covid 19 and strep. He has fully recovered from that. He denies any new problems or issues currently. We gave him the last 2 gifts from [place].

## 2018-08-16 NOTE — Assessment & Plan Note (Signed)
Blood pressure appears to be adequately controlled at goal of 140/90 with current hydrochlorothiazide and amlodipine.  No adverse side effects.  No red flags/warning symptoms on exam.  Encouraged to monitor blood pressure at home.  Continue current dose of hydrochlorothiazide and amlodipine.
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
## 2018-08-17 LAB — CD4/CD8 (T-HELPER/T-SUPPRESSOR CELL)
CD4 % Helper T Cell: 43.6
CD4 Count: 872
CD8 % Suppressor T Cell: 19.1
CD8 T Cell Abs: 382

## 2018-08-21 LAB — COMPREHENSIVE METABOLIC PANEL
AG Ratio: 1.4 (calc) (ref 1.0–2.5)
ALT: 15 U/L (ref 9–46)
AST: 19 U/L (ref 10–40)
Albumin: 4.4 g/dL (ref 3.6–5.1)
Alkaline phosphatase (APISO): 60 U/L (ref 36–130)
BUN: 9 mg/dL (ref 7–25)
CO2: 29 mmol/L (ref 20–32)
Calcium: 9.7 mg/dL (ref 8.6–10.3)
Chloride: 102 mmol/L (ref 98–110)
Creat: 1.16 mg/dL (ref 0.60–1.35)
Globulin: 3.1 g/dL (calc) (ref 1.9–3.7)
Glucose, Bld: 99 mg/dL (ref 65–99)
Potassium: 3.9 mmol/L (ref 3.5–5.3)
Sodium: 138 mmol/L (ref 135–146)
Total Bilirubin: 0.3 mg/dL (ref 0.2–1.2)
Total Protein: 7.5 g/dL (ref 6.1–8.1)

## 2018-08-21 LAB — LIPASE: Lipase: 26 U/L (ref 7–60)

## 2018-08-21 LAB — AMYLASE: Amylase: 42 U/L (ref 21–101)

## 2018-08-21 LAB — CK: Total CK: 347 U/L — ABNORMAL HIGH (ref 44–196)

## 2018-08-21 LAB — PHOSPHORUS: Phosphorus: 3.1 mg/dL (ref 2.5–4.5)

## 2018-08-21 LAB — RPR: RPR Ser Ql: NONREACTIVE

## 2018-08-21 LAB — HIV-1 RNA QUANT-NO REFLEX-BLD
HIV 1 RNA Quant: <20 copies/mL — AB
HIV-1 RNA Quant, Log: <1.3 Log copies/mL — AB

## 2018-08-22 ENCOUNTER — Encounter: Payer: Self-pay | Admitting: Family Medicine

## 2018-08-22 ENCOUNTER — Encounter: Payer: Self-pay | Admitting: *Deleted

## 2018-08-22 ENCOUNTER — Other Ambulatory Visit: Payer: Self-pay

## 2018-08-22 ENCOUNTER — Ambulatory Visit: Payer: Self-pay | Attending: Family Medicine | Admitting: Family Medicine

## 2018-08-22 VITALS — BP 147/91 | HR 76 | Temp 98.0°F | Ht 74.0 in | Wt 227.8 lb

## 2018-08-22 DIAGNOSIS — Z21 Asymptomatic human immunodeficiency virus [HIV] infection status: Secondary | ICD-10-CM

## 2018-08-22 DIAGNOSIS — L299 Pruritus, unspecified: Secondary | ICD-10-CM

## 2018-08-22 DIAGNOSIS — I10 Essential (primary) hypertension: Secondary | ICD-10-CM

## 2018-08-22 DIAGNOSIS — R7303 Prediabetes: Secondary | ICD-10-CM

## 2018-08-22 DIAGNOSIS — G43709 Chronic migraine without aura, not intractable, without status migrainosus: Secondary | ICD-10-CM

## 2018-08-22 DIAGNOSIS — K219 Gastro-esophageal reflux disease without esophagitis: Secondary | ICD-10-CM

## 2018-08-22 LAB — POCT GLYCOSYLATED HEMOGLOBIN (HGB A1C): Hemoglobin A1C: 5.8 % — AB (ref 4.0–5.6)

## 2018-08-22 MED ORDER — PANTOPRAZOLE SODIUM 40 MG PO TBEC: 40.0000 mg | DELAYED_RELEASE_TABLET | Freq: Every day | ORAL | 3 refills | Status: DC

## 2018-08-22 MED ORDER — KETOCONAZOLE 2 % EX SHAM: MEDICATED_SHAMPOO | CUTANEOUS | 3 refills | Status: DC

## 2018-08-22 MED ORDER — KETOCONAZOLE 2 % EX CREA: 1.0000 "application " | TOPICAL_CREAM | Freq: Two times a day (BID) | CUTANEOUS | 1 refills | Status: DC

## 2018-08-22 MED ORDER — FLUCONAZOLE 100 MG PO TABS: 100.0000 mg | ORAL_TABLET | Freq: Every day | ORAL | 0 refills | Status: AC

## 2018-08-22 MED ORDER — HYDROXYZINE HCL 25 MG PO TABS: 25.0000 mg | ORAL_TABLET | Freq: Three times a day (TID) | ORAL | 0 refills | Status: DC | PRN

## 2018-08-22 MED ORDER — IBUPROFEN 800 MG PO TABS: 800.0000 mg | ORAL_TABLET | Freq: Three times a day (TID) | ORAL | 2 refills | Status: DC | PRN
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
## 2018-08-22 NOTE — Patient Instructions (Signed)
Food Choices for Gastroesophageal Reflux Disease, Adult When you have gastroesophageal reflux disease (GERD), the foods you eat and your eating habits are very important. Choosing the right foods can help ease your discomfort. Think about working with a nutrition specialist (dietitian) to help you make good choices. What are tips for following this plan?  Meals  Choose healthy foods that are low in fat, such as fruits, vegetables, whole grains, low-fat dairy products, and lean meat, fish, and poultry.  Eat small meals often instead of 3 large meals a day. Eat your meals slowly, and in a place where you are relaxed. Avoid bending over or lying down until 2-3 hours after eating.  Avoid eating meals 2-3 hours before bed.  Avoid drinking a lot of liquid with meals.  Cook foods using methods other than frying. Bake, grill, or broil food instead.  Avoid or limit: ? Chocolate. ? Peppermint or spearmint. ? Alcohol. ? Pepper. ? Black and decaffeinated coffee. ? Black and decaffeinated tea. ? Bubbly (carbonated) soft drinks. ? Caffeinated energy drinks and soft drinks.  Limit high-fat foods such as: ? Fatty meat or fried foods. ? Whole milk, cream, butter, or ice cream. ? Nuts and nut butters. ? Pastries, donuts, and sweets made with butter or shortening.  Avoid foods that cause symptoms. These foods may be different for everyone. Common foods that cause symptoms include: ? Tomatoes. ? Oranges, lemons, and limes. ? Peppers. ? Spicy food. ? Onions and garlic. ? Vinegar. Lifestyle  Maintain a healthy weight. Ask your doctor what weight is healthy for you. If you need to lose weight, work with your doctor to do so safely.  Exercise for at least 30 minutes for 5 or more days each week, or as told by your doctor.  Wear loose-fitting clothes.  Do not smoke. If you need help quitting, ask your doctor.  Sleep with the head of your bed higher than your feet. Use a wedge under the  mattress or blocks under the bed frame to raise the head of the bed. Summary  When you have gastroesophageal reflux disease (GERD), food and lifestyle choices are very important in easing your symptoms.  Eat small meals often instead of 3 large meals a day. Eat your meals slowly, and in a place where you are relaxed.  Limit high-fat foods such as fatty meat or fried foods.  Avoid bending over or lying down until 2-3 hours after eating.  Avoid peppermint and spearmint, caffeine, alcohol, and chocolate. This information is not intended to replace advice given to you by your health care provider. Make sure you discuss any questions you have with your health care provider. Document Released: 06/28/2011 Document Revised: 04/19/2018 Document Reviewed: 02/02/2016 Elsevier Patient Education  2020 [place].  Preventing Type 2 Diabetes Mellitus Type 2 diabetes (type 2 diabetes mellitus) is a long-term (chronic) disease that affects blood sugar (glucose) levels. Normally, a hormone called insulin allows glucose to enter cells in the body. The cells use glucose for energy. In type 2 diabetes, one or both of these problems may be present:  The body does not make enough insulin.  The body does not respond properly to insulin that it makes (insulin resistance). Insulin resistance or lack of insulin causes excess glucose to build up in the blood instead of going into cells. As a result, high blood glucose (hyperglycemia) develops, which can cause many complications. Being overweight or obese and having an inactive (sedentary) lifestyle can increase your risk for diabetes.  Type 2 diabetes can be delayed or prevented by making certain nutrition and lifestyle changes. What nutrition changes can be made?   Eat healthy meals and snacks regularly. Keep a healthy snack with you for when you get hungry between meals, such as fruit or a handful of nuts.  Eat lean meats and proteins that are low in saturated  fats, such as chicken, fish, egg whites, and beans. Avoid processed meats.  Eat plenty of fruits and vegetables and plenty of grains that have not been processed (whole grains). It is recommended that you eat: ? 1?2 cups of fruit every day. ? 2?3 cups of vegetables every day. ? 6?8 oz of whole grains every day, such as oats, whole wheat, bulgur, brown rice, quinoa, and millet.  Eat low-fat dairy products, such as milk, yogurt, and cheese.  Eat foods that contain healthy fats, such as nuts, avocado, olive oil, and canola oil.  Drink water throughout the day. Avoid drinks that contain added sugar, such as soda or sweet tea.  Follow instructions from your health care provider about specific eating or drinking restrictions.  Control how much food you eat at a time (portion size). ? Check food labels to find out the serving sizes of foods. ? Use a kitchen scale to weigh amounts of foods.  Saute or steam food instead of frying it. Cook with water or broth instead of oils or butter.  Limit your intake of: ? Salt (sodium). Have no more than 1 tsp (2,400 mg) of sodium a day. If you have heart disease or high blood pressure, have less than ? tsp (1,500 mg) of sodium a day. ? Saturated fat. This is fat that is solid at room temperature, such as butter or fat on meat. What lifestyle changes can be made? Activity   Do moderate-intensity physical activity for at least 30 minutes on at least 5 days of the week, or as much as told by your health care provider.  Ask your health care provider what activities are safe for you. A mix of physical activities may be best, such as walking, swimming, cycling, and strength training.  Try to add physical activity into your day. For example: ? Park in spots that are farther away than usual, so that you walk more. For example, park in a far corner of the parking lot when you go to the office or the grocery store. ? Take a walk during your lunch  break. ? Use stairs instead of elevators or escalators. Weight Loss  Lose weight as directed. Your health care provider can determine how much weight loss is best for you and can help you lose weight safely.  If you are overweight or obese, you may be instructed to lose at least 5?7 % of your body weight. Alcohol and Tobacco   Limit alcohol intake to no more than 1 drink a day for nonpregnant women and 2 drinks a day for men. One drink equals 12 oz of beer, 5 oz of wine, or 1 oz of hard liquor.  Do not use any tobacco products, such as cigarettes, chewing tobacco, and e-cigarettes. If you need help quitting, ask your health care provider. Work With [facility] Provider  Have your blood glucose tested regularly, as told by your health care provider.  Discuss your risk factors and how you can reduce your risk for diabetes.  Get screening tests as told by your health care provider. You may have screening tests regularly, especially if you  have certain risk factors for type 2 diabetes.  Make an appointment with a diet and nutrition specialist (registered dietitian). A registered dietitian can help you make a healthy eating plan and can help you understand portion sizes and food labels. Why are these changes important?  It is possible to prevent or delay type 2 diabetes and related health problems by making lifestyle and nutrition changes.  It can be difficult to recognize signs of type 2 diabetes. The best way to avoid possible damage to your body is to take actions to prevent the disease before you develop symptoms. What can happen if changes are not made?  Your blood glucose levels may keep increasing. Having high blood glucose for a long time is dangerous. Too much glucose in your blood can damage your blood vessels, heart, kidneys, nerves, and eyes.  You may develop prediabetes or type 2 diabetes. Type 2 diabetes can lead to many chronic health problems and complications, such  as: ? Heart disease. ? Stroke. ? Blindness. ? Kidney disease. ? Depression. ? Poor circulation in the feet and legs, which could lead to surgical removal (amputation) in severe cases. Where to find support  Ask your health care provider to recommend a registered dietitian, diabetes educator, or weight loss program.  Look for local or online weight loss groups.  Join a gym, fitness club, or outdoor activity group, such as a walking club. Where to find more information To learn more about diabetes and diabetes prevention, visit:  American Diabetes Association (ADA): www.diabetes.org  National Institute of Diabetes and Digestive and Kidney Diseases: www.niddk.nih.gov/health-information/diabetes To learn more about healthy eating, visit:  The U.S. Department of Agriculture (USDA), Choose My Plate: www.choosemyplate.gov/food-groups  Office of Disease Prevention and Health Promotion (ODPHP), Dietary Guidelines: www.health.gov/dietaryguidelines Summary  You can reduce your risk for type 2 diabetes by increasing your physical activity, eating healthy foods, and losing weight as directed.  Talk with your health care provider about your risk for type 2 diabetes. Ask about any blood tests or screening tests that you need to have. This information is not intended to replace advice given to you by your health care provider. Make sure you discuss any questions you have with your health care provider. Document Released: 04/20/2015 Document Revised: 04/20/2018 Document Reviewed: 02/17/2015 Elsevier Patient Education  2020 [employer]  

## 2018-08-22 NOTE — Progress Notes (Signed)
Per pt he gets itchy randomly and do not know why.

## 2018-08-25 ENCOUNTER — Encounter: Payer: Self-pay | Admitting: Nurse Practitioner

## 2018-08-25 DIAGNOSIS — G43709 Chronic migraine without aura, not intractable, without status migrainosus: Secondary | ICD-10-CM

## 2018-08-25 DIAGNOSIS — K219 Gastro-esophageal reflux disease without esophagitis: Secondary | ICD-10-CM

## 2018-08-25 DIAGNOSIS — L299 Pruritus, unspecified: Secondary | ICD-10-CM

## 2018-08-27 NOTE — Telephone Encounter (Signed)
Please resend to our pharmacy and inform patient

## 2018-08-28 MED ORDER — PANTOPRAZOLE SODIUM 40 MG PO TBEC: 40.0000 mg | DELAYED_RELEASE_TABLET | Freq: Every day | ORAL | 3 refills | Status: AC

## 2018-08-28 MED ORDER — HYDROXYZINE HCL 25 MG PO TABS: 25.0000 mg | ORAL_TABLET | Freq: Three times a day (TID) | ORAL | 0 refills | Status: AC | PRN

## 2018-08-28 MED ORDER — KETOCONAZOLE 2 % EX CREA: 1.0000 "application " | TOPICAL_CREAM | Freq: Two times a day (BID) | CUTANEOUS | 1 refills | Status: AC

## 2018-08-28 MED ORDER — IBUPROFEN 800 MG PO TABS: 800.0000 mg | ORAL_TABLET | Freq: Three times a day (TID) | ORAL | 2 refills | Status: AC | PRN

## 2018-08-28 MED ORDER — KETOCONAZOLE 2 % EX SHAM: MEDICATED_SHAMPOO | CUTANEOUS | 3 refills | Status: AC

## 2018-08-28 MED FILL — IBUPROFEN 800 MG TABLET: 800 | 20 days supply | Qty: 60 | Fill #0

## 2018-08-28 MED FILL — PANTOPRAZOLE SOD DR 40 MG T: 40 | 30 days supply | Qty: 30 | Fill #0

## 2018-08-28 MED FILL — hydrOXYzine HCL 25 MG TABS: 25 | 20 days supply | Qty: 60 | Fill #0

## 2018-08-28 MED FILL — KETOCONAZOLE 2% SHAMPOO: 2 | 30 days supply | Qty: 120 | Fill #0

## 2018-08-28 MED FILL — KETOCONAZOLE 2% CREAM: 2 | 15 days supply | Qty: 60 | Fill #0

## 2018-10-20 ENCOUNTER — Encounter (HOSPITAL_COMMUNITY): Payer: Self-pay | Admitting: Emergency Medicine

## 2018-10-20 ENCOUNTER — Emergency Department (HOSPITAL_COMMUNITY)
Admission: EM | Admit: 2018-10-20 | Discharge: 2018-11-11 | Disposition: E | Payer: Self-pay | Attending: Emergency Medicine | Admitting: Emergency Medicine

## 2018-10-20 DIAGNOSIS — I10 Essential (primary) hypertension: Secondary | ICD-10-CM | POA: Insufficient documentation

## 2018-10-20 DIAGNOSIS — Z21 Asymptomatic human immunodeficiency virus [HIV] infection status: Secondary | ICD-10-CM | POA: Insufficient documentation

## 2018-10-20 DIAGNOSIS — I469 Cardiac arrest, cause unspecified: Secondary | ICD-10-CM | POA: Insufficient documentation

## 2018-10-20 HISTORY — DX: Human immunodeficiency virus (HIV) disease: B20

## 2018-10-20 HISTORY — DX: Asymptomatic human immunodeficiency virus (hiv) infection status: Z21

## 2018-10-20 MED ORDER — SODIUM BICARBONATE 8.4 % IV SOLN
INTRAVENOUS | Status: AC | PRN
  Administered 2018-10-20: 50 meq via INTRAVENOUS

## 2018-10-20 MED ORDER — EPINEPHRINE 1 MG/10ML IJ SOSY
PREFILLED_SYRINGE | INTRAMUSCULAR | Status: AC | PRN
  Administered 2018-10-20 (×3): 1 mg via INTRAVENOUS

## 2018-10-20 MED ORDER — ATROPINE SULFATE 1 MG/ML IJ SOLN
INTRAMUSCULAR | Status: AC | PRN
  Administered 2018-10-20: 1 mg via INTRAVENOUS

## 2018-10-20 MED FILL — Medication: Qty: 1 | Status: AC

## 2018-10-22 ENCOUNTER — Encounter: Payer: Self-pay | Admitting: Family Medicine

## 2018-10-25 ENCOUNTER — Other Ambulatory Visit: Payer: Self-pay | Admitting: Family Medicine

## 2018-11-11 NOTE — Code Documentation (Signed)
Chest compressions continues. 

## 2018-11-11 NOTE — Code Documentation (Signed)
Pulse check - Asystole , chest compressions continues.

## 2018-11-11 NOTE — Code Documentation (Signed)
Pulse check - no palpable pulse/PEA . EDP declared dead at 56.

## 2018-11-11 NOTE — ED Triage Notes (Addendum)
Patient arrived with EMS from home found unresponsive - he received 6 doses of Epinephrine for PEA/Asystole prior to arrival . Intraosseous access at left tibia. CPR ( [place]) in progress at arrival .

## 2018-11-11 NOTE — ED Provider Notes (Signed)
Emergency Department Provider Note   I have reviewed the triage vital signs and the nursing notes.   HISTORY  Chief Complaint Cardiac Arrest   HPI Douglas Bridges is a 28 y.o. male who presents the emergency department today secondary to cardiac arrest.  Patient has only known history of HIV hypertension.  Apparently patient was feeling unwell throughout the day then had episode of dizziness and subsequently syncopized.  EMS was called on their arrival he was initially unresponsive with a radial pulse but shortly became asystolic.  CPR was initiated and he had copious amounts of vomitus.  He was given multiple doses of epinephrine and had PEA for a while then once again returned to asystole prior to arrival.  Glucose was 231 with EMS.  LEVEL V CAVEAT APPLIES SECONDARY TO asystole   Past Medical History:  Diagnosis Date  . HIV (human immunodeficiency virus infection) ([place])   . Hypertension     There are no active problems to display for this patient.   History reviewed. No pertinent surgical history.    Allergies Patient has no known allergies.  No family history on file.  Social History Social History   Tobacco Use  . Smoking status: Unknown If Ever Smoked  Substance Use Topics  . Alcohol use: Not on file  . Drug use: Not on file    Review of Systems  LEVEL V CAVEAT APPLIES SECONDARY TO asystole ____________________________________________  PHYSICAL EXAM:  VITAL SIGNS: ED Triage Vitals [October 21, 2018 0229]  Enc Vitals Group     BP      Pulse      Resp      Temp      Temp src      SpO2      Weight 200 lb (90.7 kg)     Height 6' (1.829 m)     Head Circumference      Peak Flow      Pain Score      Pain Loc      Pain Edu?      Excl. in [place]?     Constitutional: Well appearing. Well nourished. Eyes: Conjunctivae are normal. Equal nonreactive Pupils.  Head: Atraumatic. Nose: No congestion/rhinnorhea. Mouth/Throat: Mucous membranes are moist.   Oropharynx non-erythematous. Neck: No stridor.  No meningeal signs.   Cardiovascular: no rate, no rhythm.  Respiratory: no respiratory effort.  Lungs diminshed. Gastrointestinal: Soft and No distention.  Musculoskeletal: No lower extremity tenderness nor edema. No gross deformities of extremities. Neurologic:  Not able to assess 2/2 condition.  Skin:   No rash noted. Psych: Not able to assess 2/2 condition.   ____________________________________________   LABS (all labs ordered are listed, but only abnormal results are displayed)  Labs Reviewed - No data to display ____________________________________________  EKG   ____________________________________________  RADIOLOGY  No results found. ____________________________________________   PROCEDURES  Procedure(s) performed:   Procedure Name: Intubation Date/Time: 10/21/2018 2:36 AM Performed by: Merrily Pew, MD Pre-anesthesia Checklist: Patient identified, Patient being monitored, Emergency Drugs available, Timeout performed and Suction available Oxygen Delivery Method: Non-rebreather mask Preoxygenation: Pre-oxygenation with 100% oxygen Induction Type: Rapid sequence Ventilation: Mask ventilation without difficulty Laryngoscope Size: Mac and 4 Grade View: Grade II Tube size: 7.5 mm Number of attempts: 1 Placement Confirmation: ETT inserted through vocal cords under direct vision,  CO2 detector and Breath sounds checked- equal and bilateral Secured at: 24 cm Tube secured with: ETT holder Dental Injury: Teeth and Oropharynx as per pre-operative assessment  Future  Recommendations: Recommend- induction with short-acting agent, and alternative techniques readily available    .Critical Care Performed by: Merrily Pew, MD Authorized by: Merrily Pew, MD   Critical care provider statement:    Critical care time (minutes):  31   Critical care was necessary to treat or prevent imminent or life-threatening  deterioration of the following conditions:  CNS failure or compromise, cardiac failure and respiratory failure  Cardiopulmonary Resuscitation (CPR) Procedure Note Directed/Performed by: Merrily Pew I personally directed ancillary staff and/or performed CPR in an effort to regain return of spontaneous circulation and to maintain cardiac, neuro and systemic perfusion.   ____________________________________________   INITIAL IMPRESSION / ASSESSMENT AND PLAN / ED COURSE  Pertinent labs & imaging results that were available during my care of the patient were reviewed by me and considered in my medical decision making (see chart for details).  Multiple doses of epinephrine given.  Sodium bicarb given.  Multiple ultrasound showing cardiac standstill.  End tidal CO2 below 15 generally in the 10-12 range.  Atropine given as a hail Mary secondary to slow PEA which did increase the rate of his PA but patient with no cardiac motion even with increased rate.  Remained pulseless.  Consider giving alteplase for possible pulmonary embolus however at that time the patient Artie been receiving CPR for approximately 1 hour making any kind of meaningful outcome unlikely even if successful.  Patient was pronounced dead at 0204. Contacted medical examiner office who spoke to pathologist, will not be ME case. Family updated.   ____________________________________________  FINAL CLINICAL IMPRESSION(S) / ED DIAGNOSES  Final diagnoses:  Cardiac arrest [facility])    MEDICATIONS GIVEN DURING THIS VISIT:  Medications  EPINEPHrine (ADRENALIN) 1 MG/10ML injection (1 mg Intravenous Given 10/23/18 0202)  sodium bicarbonate injection (50 mEq Intravenous Given 10/23/2018 0153)  atropine injection (1 mg Intravenous Given 10/23/18 0159)    NEW OUTPATIENT MEDICATIONS STARTED DURING THIS VISIT:  New Prescriptions   No medications on file    Note:  This document was prepared using Dragon voice recognition software and may  include unintentional dictation errors.    Adamarys Shall, Corene Cornea, MD Oct 23, 2018 [phone]

## 2018-11-11 NOTE — ED Notes (Signed)
Post Mortem care rendered.  

## 2018-11-11 NOTE — Progress Notes (Signed)
Chaplain assisted medical staff in providing spiritual presence and support to grieving family of B[place] with the assistance of medical staff.  Chaplain assisted in family members viewing Lazarius's body.

## 2018-11-11 NOTE — ED Notes (Signed)
Family at bedside with Chaplain for emotional support.

## 2018-11-11 NOTE — Code Documentation (Addendum)
Chest compressions continues . IV 20g. Left AC accessed. EDP/RT intubated patient at arrival .

## 2018-11-11 NOTE — ED Notes (Signed)
[facility] notified by RN , Ref.# [id number] Tonye Becket.

## 2018-11-11 DEATH — deceased

## 2019-03-11 ENCOUNTER — Telehealth: Payer: Self-pay

## 2019-03-11 NOTE — Telephone Encounter (Signed)
Received call today from from Mr. Defranco husband stating patient passed away.  Douglas Bridges
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
